# Patient Record
Sex: Female | Born: 1935 | Race: White | Hispanic: No | Smoking: Never smoker
Health system: Southern US, Community
[De-identification: ages and names within clinical notes are randomized; demographics above are authoritative.]

## PROBLEM LIST (undated history)

## (undated) DIAGNOSIS — M1990 Unspecified osteoarthritis, unspecified site: Secondary | ICD-10-CM

## (undated) DIAGNOSIS — I2510 Atherosclerotic heart disease of native coronary artery without angina pectoris: Secondary | ICD-10-CM

## (undated) DIAGNOSIS — E785 Hyperlipidemia, unspecified: Secondary | ICD-10-CM

## (undated) DIAGNOSIS — I10 Essential (primary) hypertension: Secondary | ICD-10-CM

## (undated) DIAGNOSIS — N390 Urinary tract infection, site not specified: Secondary | ICD-10-CM

## (undated) DIAGNOSIS — B029 Zoster without complications: Secondary | ICD-10-CM

## (undated) DIAGNOSIS — M4800 Spinal stenosis, site unspecified: Secondary | ICD-10-CM

## (undated) DIAGNOSIS — I350 Nonrheumatic aortic (valve) stenosis: Secondary | ICD-10-CM

## (undated) DIAGNOSIS — Z87312 Personal history of (healed) stress fracture: Secondary | ICD-10-CM

## (undated) HISTORY — DX: Spinal stenosis, site unspecified: M48.00

## (undated) HISTORY — DX: Nonrheumatic aortic (valve) stenosis: I35.0

## (undated) HISTORY — DX: Essential (primary) hypertension: I10

## (undated) HISTORY — DX: Hyperlipidemia, unspecified: E78.5

## (undated) HISTORY — PX: ABDOMINAL SURGERY: SHX537

## (undated) HISTORY — DX: Atherosclerotic heart disease of native coronary artery without angina pectoris: I25.10

## (undated) HISTORY — PX: EYE SURGERY: SHX253

---

## 1998-01-23 ENCOUNTER — Other Ambulatory Visit: Admission: RE | Admit: 1998-01-23 | Discharge: 1998-01-23 | Payer: Self-pay | Admitting: Internal Medicine

## 2000-10-08 ENCOUNTER — Encounter: Admission: RE | Admit: 2000-10-08 | Discharge: 2000-10-08 | Payer: Self-pay | Admitting: Geriatric Medicine

## 2000-10-08 ENCOUNTER — Encounter: Payer: Self-pay | Admitting: Geriatric Medicine

## 2001-11-11 ENCOUNTER — Ambulatory Visit (HOSPITAL_BASED_OUTPATIENT_CLINIC_OR_DEPARTMENT_OTHER): Admission: RE | Admit: 2001-11-11 | Discharge: 2001-11-11 | Payer: Self-pay | Admitting: Plastic Surgery

## 2001-12-01 ENCOUNTER — Emergency Department (HOSPITAL_COMMUNITY): Admission: EM | Admit: 2001-12-01 | Discharge: 2001-12-02 | Payer: Self-pay | Admitting: Emergency Medicine

## 2001-12-01 ENCOUNTER — Encounter: Payer: Self-pay | Admitting: Emergency Medicine

## 2001-12-04 ENCOUNTER — Inpatient Hospital Stay (HOSPITAL_COMMUNITY): Admission: EM | Admit: 2001-12-04 | Discharge: 2001-12-10 | Payer: Self-pay | Admitting: *Deleted

## 2001-12-05 ENCOUNTER — Encounter: Payer: Self-pay | Admitting: General Surgery

## 2003-08-23 ENCOUNTER — Encounter: Admission: RE | Admit: 2003-08-23 | Discharge: 2003-08-23 | Payer: Self-pay | Admitting: Geriatric Medicine

## 2004-01-21 ENCOUNTER — Encounter: Admission: RE | Admit: 2004-01-21 | Discharge: 2004-01-21 | Payer: Self-pay | Admitting: Geriatric Medicine

## 2007-05-05 ENCOUNTER — Encounter: Admission: RE | Admit: 2007-05-05 | Discharge: 2007-05-05 | Payer: Self-pay | Admitting: Geriatric Medicine

## 2007-05-11 ENCOUNTER — Encounter: Admission: RE | Admit: 2007-05-11 | Discharge: 2007-05-11 | Payer: Self-pay | Admitting: Geriatric Medicine

## 2007-10-17 ENCOUNTER — Ambulatory Visit: Payer: Self-pay | Admitting: Internal Medicine

## 2007-10-17 ENCOUNTER — Inpatient Hospital Stay (HOSPITAL_COMMUNITY): Admission: EM | Admit: 2007-10-17 | Discharge: 2007-10-18 | Payer: Self-pay | Admitting: Emergency Medicine

## 2007-10-18 HISTORY — PX: CARDIAC CATHETERIZATION: SHX172

## 2009-04-06 ENCOUNTER — Encounter: Admission: RE | Admit: 2009-04-06 | Discharge: 2009-04-06 | Payer: Self-pay | Admitting: Neurological Surgery

## 2009-11-17 ENCOUNTER — Ambulatory Visit: Payer: Self-pay | Admitting: Diagnostic Radiology

## 2009-11-17 ENCOUNTER — Emergency Department (HOSPITAL_BASED_OUTPATIENT_CLINIC_OR_DEPARTMENT_OTHER): Admission: EM | Admit: 2009-11-17 | Discharge: 2009-11-18 | Payer: Self-pay | Admitting: Emergency Medicine

## 2010-08-06 ENCOUNTER — Ambulatory Visit: Payer: Self-pay | Admitting: Cardiovascular Disease

## 2010-09-08 ENCOUNTER — Encounter: Payer: Self-pay | Admitting: Geriatric Medicine

## 2010-09-09 ENCOUNTER — Encounter: Payer: Self-pay | Admitting: Geriatric Medicine

## 2010-11-06 LAB — URINALYSIS, ROUTINE W REFLEX MICROSCOPIC
Glucose, UA: NEGATIVE mg/dL
Hgb urine dipstick: NEGATIVE
Ketones, ur: NEGATIVE mg/dL
pH: 5.5 (ref 5.0–8.0)

## 2010-11-06 LAB — DIFFERENTIAL
Basophils Relative: 0 % (ref 0–1)
Eosinophils Absolute: 0.1 10*3/uL (ref 0.0–0.7)
Monocytes Absolute: 0.7 10*3/uL (ref 0.1–1.0)
Monocytes Relative: 7 % (ref 3–12)
Neutro Abs: 8.1 10*3/uL — ABNORMAL HIGH (ref 1.7–7.7)

## 2010-11-06 LAB — URINE MICROSCOPIC-ADD ON

## 2010-11-06 LAB — CBC
Platelets: 288 10*3/uL (ref 150–400)
RDW: 12.5 % (ref 11.5–15.5)

## 2010-11-06 LAB — COMPREHENSIVE METABOLIC PANEL
ALT: 12 U/L (ref 0–35)
AST: 24 U/L (ref 0–37)
Albumin: 4.1 g/dL (ref 3.5–5.2)
Alkaline Phosphatase: 56 U/L (ref 39–117)
Potassium: 3.6 mEq/L (ref 3.5–5.1)
Sodium: 145 mEq/L (ref 135–145)
Total Protein: 7.9 g/dL (ref 6.0–8.3)

## 2010-11-06 LAB — POCT CARDIAC MARKERS: Myoglobin, poc: 61.4 ng/mL (ref 12–200)

## 2010-12-31 NOTE — Cardiovascular Report (Signed)
NAMESHERMAN, LIPUMA NO.:  [id number]   MEDICAL RECORD NO.:  [id number]          PATIENT TYPE:  INP   LOCATION:  3713                         FACILITY:  [facility]   PHYSICIAN:  Vesta Mixer, M.D. DATE OF BIRTH:  [birth year]   DATE OF PROCEDURE:  10/18/2007  DATE OF DISCHARGE:  10/18/2007                            CARDIAC CATHETERIZATION   [name] Fields is an elderly female who was admitted for cardiac  catheterization after having episodes of chest pain.   PROCEDURE:  Left heart catheterization with coronary angiography.  The  right femoral artery was easily cannulated using modified Seldinger  technique.   HEMODYNAMICS:  LV pressure is 114/0 with an aortic pressure of 103/53.   ANGIOGRAPHY:  Left Main.  The left main has minor luminal  irregularities, but it is basically unremarkable.   There is a room of calcium in the proximal aspect of the left main.   Left anterior descending artery is moderate-sized branch.  There are  mild irregularities.  There is a 10-20% stenosis proximally followed by  a 30% stenosis in the mid segment.  The distal LAD is fairly tortuous,  but is otherwise unremarkable.   There is a relatively small first diagonal branch.  There is a  moderately tight stenosis in the first diagonal branch between 70-80%.  This stenosis arises just after the takeoff.  The diameter of this  branch is approximately 1.5 mm in diameter, and is not a good candidate  for angioplasty.   The second diagonal branch is basically unremarkable.   The left circumflex artery is a moderate-sized branch and is fairly  tortuous.  It gives off the first obtuse marginal artery which has minor  luminal irregularities.  The terminal circumflex artery is unremarkable.   The right coronary artery is moderate size and is dominant.  There is  mild-to-moderate irregularities.  There is mid 30-40% stenosis.  The  distal RCA is unremarkable.  The right coronary  artery gives off a  moderate-sized posterior descending artery, which is fairly normal.  The  posterolateral branch is quite small.   The left ventriculogram was performed in a 30-RAO position.  It reveals  hyperdynamic left ventricular systolic function.  The left ventricular  ejection fraction is around 70%.  There are no segmental wall motion  abnormalities.  There is no significant mitral regurgitation.   COMPLICATIONS:  None.   CONCLUSIONS:  1. Moderate coronary artery disease.  There is a tight stenosis in a      very small first diagonal artery.  This vessel is proximally 1.5 mm      in size and really is not a good candidate for angioplasty.  If she      continues to have angina, we could consider a balloon angioplasty      or perhaps cutting balloon procedure.  I do not think that it is      big      enough to warrant stenting, although we could consider using a      MiniVision stent if needed.  We will continue with aggressive  medical therapy.  2. She has an aortic valve gradient of approximately 10 mmHg.  We will      continue to follow this.           ______________________________  Vesta Mixer, M.D.     PJN/MEDQ  D:  12/09/2007  T:  12/09/2007  Job:  [id number]

## 2010-12-31 NOTE — H&P (Signed)
[name], [name] Fields NO.:  [id number]   MEDICAL RECORD NO.:  [id number]          PATIENT TYPE:  EMS   LOCATION:  MAJO                         FACILITY:  [facility]   PHYSICIAN:  [name] Furlong, MD      DATE OF BIRTH:  1925/01/06   DATE OF ADMISSION:  10/17/2007  DATE OF DISCHARGE:                              HISTORY & PHYSICAL   PRIMARY CARE PHYSICIAN:  Hal T. Stoneking, M.D.   CHIEF COMPLAINT:  Chest pain.   HISTORY OF PRESENT ILLNESS:  [name] is an 77 year old female.  She  started having intermittent sharp chest pain in the center of chest  associated with radiation to both sides of jaws and mid thoracic back.  She felt like she is having acid reflux and indigestion.  She tried Gas-  X that did not help.  She took two aspirins that did not help either.  She did have some shortness of breath with chest pain.  Chest pain  lasted less than one minute very time it came on.  The patient did not  have a history of gallstones, gastroesophageal reflux disease, pneumonia  or heart disease.  She had history of stress test 10 years ago, but she  does not have any history of cardiac catheterization.  The patient  denied any alcohol or tobacco use.   PAST MEDICAL HISTORY:  1. Hypertension.  2. Osteoporosis.  3. Spinal stenosis.   ALLERGIES:  No known drug allergies.   MEDICATIONS:  Hydrochlorothiazide.   FAMILY HISTORY:  Noncontributory.  Family history of diabetes mellitus  in the father.   SOCIAL HISTORY:  The patient denied any alcohol, drug or tobacco use.   REVIEW OF SYSTEMS:  Positive as per HPI, otherwise, negative.  Review of  systems done on 14 systems.   PHYSICAL EXAMINATION:  VITAL SIGNS:  Blood pressure 159/60, heart rate  81, temperature 97.3, respirations 18, oxygen saturation 99% on 1  liters.  GENERAL:  Alert and oriented x3.  No acute distress.  CARDIOVASCULAR:  S1, S2 regular.  No murmurs, rubs or gallops.  LUNG:  Clear to auscultation  bilaterally.  No wheezes, rales or rhonchi.  ABDOMEN:  Nontender, nondistended, bowel sounds present.  No  organomegaly.  EXTREMITIES:  No cyanosis, clubbing or edema.  Pulses palpable in all  four extremities.  HEENT:  Normocephalic, atraumatic.  Eyes:  Pupils equal, round and  reactive to light and accommodation.  Extraocular movements intact.  Oral cavity:  Oral mucosa moist.  No thrush noted.  NECK:  No thyromegaly or JVD.  SKIN:  No rash or bruits.  NEUROLOGICAL:  Intact cranial nerves, muscular strength, sensation and  reflexes.   LABORATORY DATA:  WBC 8.9, hemoglobin 13.6, platelets 273.  Cardiac  enzymes normal.  Creatinine 1.1.  Potassium 3.2, sodium 140, glucose 96,  BUN 22. Chest x-ray unremarkable.  EKG does not show any acute ST-T wave  changes.   ASSESSMENT/PLAN:  1. Atypical chest pain, rule out MI.  The patient may have underlying      gastroesophageal reflux disease.  2. Hypertension.  3. History of osteoporosis.  4. History of spinal stenosis.   PLAN:  1. Will admit the patient to telemetry bed. We will rule out MI with      cardiac enzymes with troponin x3.  First set is negative in the ER.      We will get exercise stress test with nuclear imaging in the      morning.  Will get [facility] consult in the morning for      stress test and further workup.  2. Will start patient on Lovenox full dose to rule out MI.  3. Will start the patient on aspirin, beta blocker, lisinopril,      Simvastatin and NitroPaste according to MI protocol.  We will      monitor the patient's blood pressure and vitals every four hours.      Will check CBC, BMP, magnesium phosphate, EKG in the morning.  4. Further plan and workup according to the labs pending in the      morning.      [name] Furlong, MD  Electronically Signed     TVP/MEDQ  D:  10/17/2007  T:  10/17/2007  Job:  [id number]   cc:   Michiel Cowboy, MD  Hal T. Stoneking, M.D.

## 2010-12-31 NOTE — Consult Note (Signed)
[name], [name] NO.:  [id number]   MEDICAL RECORD NO.:  [id number]          PATIENT TYPE:  INP   LOCATION:  1830                         FACILITY:  [facility]   PHYSICIAN:  Vesta Mixer, M.D. DATE OF BIRTH:  01/14/1925   DATE OF CONSULTATION:  10/17/2007  DATE OF DISCHARGE:                                 CONSULTATION   HISTORY OF PRESENT ILLNESS:  [name] Fields is an 77 year old female  with a history of hypertension.  She is admitted to the hospital with  episodes of chest pain.  We are asked to see her for further evaluation.   The patient has a long history of hypertension.  She has been relatively  stable.  Yesterday, she started having intermittent episodes of jaw pain  which distended down into her chest.  She had multiple episodes.  These  episodes were described as a chest pressure with radiation up into her  jaw.  She also had some very sharp pains that lasted only a few seconds.  These episodes occurred every 2-3 hours and would resolve spontaneously.  There was some shortness of breath.  There was no diaphoresis, nausea or  vomiting.  The patient presents to the emergency room where she  continued to have these episodes until she received Lovenox.  The  patient is currently pain free.   CURRENT MEDICATIONS:  1. HCTZ 25 mg a day.  2. Fosamax once a week.   PAST MEDICAL HISTORY:  Hypertension.   SOCIAL HISTORY:  The patient is a nonsmoker and nondrinker.   REVIEW OF SYSTEMS:  Negative.   PHYSICAL EXAMINATION:  GENERAL:  The patient is an elderly female in no  acute distress.  She is currently pain free.  Her heart rate 78, blood  pressure 125/70.  HEENT:  Exam reveals 2+ carotids.  No bruits, no JVD, no thyromegaly.  LUNGS:  Clear to auscultation.  HEART:  Regular rate, S1-S2.  ABDOMEN:  Reveals good bowel sounds and is nontender.  EXTREMITIES:  There is no clubbing, cyanosis or edema.  NEUROLOGICAL:  Exam is nonfocal.   STUDIES:   Her EKG reveals normal sinus rhythm.  She has no ST or T-wave  changes.   Cardiac enzymes are negative.  Her chemistry and CBC are within normal  limits.   ASSESSMENT:  [name] presents with some episodes of chest pain that  sounds consistent with unstable angina.  I have elected to cancel stress  test and proceed with heart catheterization tomorrow.  I am worried that  she may have a tight lesion and an exercise test would potentially be  dangerous.  We have discussed the risks, benefits and options of heart  catheterization.  She understands and agrees to proceed.           ______________________________  Vesta Mixer, M.D.     PJN/MEDQ  D:  10/17/2007  T:  10/18/2007  Job:  [id number]   cc:   Hal T. Stoneking, M.D.

## 2011-01-03 NOTE — Discharge Summary (Signed)
[place]  Patient:    [name] Fields, [name] Fields Visit Number: [id number] MRN: [id number]          Service Type: SUR Location: 5700 [id number] Attending Physician:  Cherylynn Ridges Dictated by:   Jimmye Norman, M.D. Admit Date:  12/03/2001 Discharge Date: 12/10/2001   CC:         Hal T. Stoneking, M.D.   Discharge Summary  DISCHARGE DIAGNOSIS: Mechanical small bowel obstruction.  PROCEDURE: Exploratory laparotomy with enterolysis.  SURGEON: Dr. Lindie Spruce.  DISCHARGE MEDICATIONS: 1. Darvocet N-100 to take as needed. 2. Reglan to take as needed.  ACTIVITY: No driving for one week postop.  WOUND CARE: Shower and pat wound dry.  FOLLOW-UP: To see me on the December 14, 2001.  HOSPITAL COURSE: The patient was admitted on December 04, 2001, early morning or late evening of December 03, 2001 with a bowel obstruction. We watched her with an NG tube in place which had drained out over three liters of feculent fluid without any drastic improvement. She was taken to surgery on December 05, 2001 at which time she had a mechanical small bowel obstruction with adhesions in the right lower quadrant and the pelvis. She underwent an enterolysis without a bowel resection and postoperatively did well. She was opened up and was able to tolerated a diet well with bowel movements by hospital day six to seven.  DISPOSITION: The patient was discharged to home in good condition. Her staples were intact and they were to be removed once she came into the clinic. Dictated by:   Jimmye Norman, M.D. Attending Physician:  Cherylynn Ridges DD:  12/22/01 TD:  12/25/01 Job: [id number]

## 2011-01-03 NOTE — Op Note (Signed)
. [facility]  Patient:    [name] Fields, [name] Fields Visit Number: [id number] MRN: [id number]          Service Type: SUR Location: 5700 [id number] [id number] Attending Physician:  Cherylynn Ridges Dictated by:   Jimmye Norman, M.D. Proc. Date: 12/05/01 Admit Date:  12/03/2001                             Operative Report  PREOPERATIVE DIAGNOSIS:  Small bowel obstruction.  POSTOPERATIVE DIAGNOSIS:  Small bowel obstruction, from intra-abdominal adhesions.  OPERATION PERFORMED:  Exploratory laparotomy.  SURGEON:  Jimmye Norman, M.D.  ASSISTANT:  None.  ANESTHESIA:  General endotracheal.  ESTIMATED BLOOD LOSS:  Less than 50 cc.  COMPLICATIONS:  None.  CONDITION:  Stable.  INDICATIONS FOR PROCEDURE:  The patient is a 77 year old female with feculent vomiting for several days.  She came to the emergency department.  Nasogastric tube was placed.  She has had over 3L of fluid taken out and now after nonresolution on abdominal films of her bowel obstruction, she goes to the operating room.  OPERATIVE FINDINGS:  The patient had adhesions of her distal small bowel the right lower quadrant from previous area of probably hysterectomy.  There was a loop of bowel which was actually kinked underneath adhesions and through a hole where there was acute obstruction.  There was no infarction or compromised bowel.  No other pathology was noted.  There was a right ovary which was left in place.  It had some nodularity to it but no evidence of malignancy.  DESCRIPTION OF PROCEDURE:  The patient was taken to the operating room and placed on the table in a supine position.  After an adequate endotracheal anesthetic was administered, she was prepped and draped in the usual sterile manner exposing the midline of the abdomen.  The lower midline incision was made using a #10 blade down to and through the old scar.  We entered the abdomen in the upper portion above the umbilicus where  there had been no previous surgery.  We were able to open it up through the midline fascia inferiorly.  We took down several adhesions of the omentum to the anterior abdominal wall but there was no abdominal contents attached to that omentum per se.  We palpated the liver, the gallbladder, the stomach, the spleen, found there to be no evidence of tumor.  In the pelvic area there was a loop of small bowel which was stuck down in the right hemipelvis which popped up and we could see the hole where it had gone through and been obstructed.  We took down the adhesions that were caught in that area which had tethered down the previously obstructed small bowel loop.  This was taken down with electrocautery and also Metzenbaum scissors.  We were able to completely bring up this loop of small bowel without injuring it.  Once this was done, we irrigated with saline and then we closed the abdomen.  Again, further exploration did not demonstrate any evidence of other pathology.   The ovary on the right side, however, was hard and prominent but had no evidence of tumor.  We reapproximated the fascia using a running #1 PDS suture and then the skin with stainless steel staples.  Sponge, needle and instrument counts were correct. Dictated by:   Jimmye Norman, M.D. Attending Physician:  Cherylynn Ridges DD:  12/06/01 TD:  12/06/01 Job: [id number]

## 2011-01-03 NOTE — H&P (Signed)
[place]  Patient:    [name] Fields, [name] Fields Visit Number: [id number] MRN: [id number]          Service Type: EMS Location: MINO Attending Physician:  Corlis Leak. Dictated by:   Jimmye Norman, M.D. Admit Date:  12/03/2001   CC:         Hal T. Stoneking, M.D.   History and Physical  IDENTIFICATION AND CHIEF COMPLAINT:  The patient is a 77 year old woman with several days of abdominal discomfort and nausea/vomiting who has a partial small bowel obstruction.  HISTORY OF PRESENT ILLNESS:  The patient has been having symptoms for the last two or three days of decreased appetite and increasing nausea and vomiting. She also passed out secondary to her vomiting one time but came in here because of continued abdominal discomfort, nausea, and vomiting.  X-rays done in the emergency department to rule out obstruction showed what was thought to be free air and a surgical consultation was obtained.  I came in to evaluate the patient, saw her initially and found that she was sitting up in bed, talking freely, without diffuse peritonitis.  She was afebrile and cognizant of everything that was going on.  Did not appear to be someone with diffuse peritonitis.  On reviewing her x-rays it looked as though she just had a small air level in the stomach under the left hemidiaphragm but no free air, certainly none on the right side.  Her x-ray was typical for a bowel obstruction.  The patient reports to me having had a feculent effluent coming from her mouth with vomiting over the last several days.  This constellation of findings makes me believe she has acute small bowel obstruction and she is being admitted for IV hydration and NG tube decompression.  PAST MEDICAL HISTORY:  Is very unremarkable.  She has had no cardiac disease, renal disease, pulmonary or liver disease.  PAST SURGICAL HISTORY:  She has had two C sections in the past and a hysterectomy.   All of these were done over 50 years ago, she said.  MEDICATIONS:  ______ is her only medication.  ALLERGIES:  No known drug allergies.  REVIEW OF SYSTEMS:  The patient has not had a bowel movement since Wednesday. No flatus in the last 24-48 hours.  PHYSICAL EXAMINATION:  GENERAL:  She is a well-nourished pleasant woman in mild to moderate acute distress.  VITAL SIGNS:  Temperature 98.8, pulse of between 101 and 126, and a blood pressure of 115/45.  HEENT:  She is normocephalic and atraumatic and anicteric.  She does have some mild conjunctival injection of her left eye.  NECK:  Supple, no bruits.  No palpable masses.  CHEST:  Clear to auscultation.  CARDIAC:  Regular rhythm.  She is mildly tachycardic.  ABDOMEN:  Distended without diffuse peritonitis or tenderness, tinkling and rushes of bowel sounds.  Old midline scar in the lower infraumbilical area.  RECTAL:  Deferred but by report is normal.  I placed an NG tube in the patient and got out over 1400 cc of feculent effluent.  LABORATORY STUDIES:  The white count is normal.  The hemoglobin is about 13 which probably is hemoconcentrated.  IMPRESSION:  Partial, possibly complete small bowel obstruction based on clinical examination and the x-ray findings.  The patient requires NG tube decompression and IV hydration.  PLAN:  The plan is to admit the patient for NG tube decompression and IV hydration along with repeating x-rays and laboratory studies.  If she should show signs of improvement in the next 24-48 hours then laparotomy will not be necessary.  However, if she shows no sign of clearing, then she will go for a laparotomy. Dictated by:   Jimmye Norman, M.D. Attending Physician:  Corlis Leak DD:  12/04/01 TD:  12/04/01 Job: [id number]

## 2011-03-24 ENCOUNTER — Other Ambulatory Visit: Payer: Self-pay | Admitting: *Deleted

## 2011-03-24 MED ORDER — METOPROLOL TARTRATE 50 MG PO TABS: ORAL_TABLET | ORAL | Status: DC

## 2011-03-24 NOTE — Telephone Encounter (Signed)
Fax received from pharmacy. Refill completed. Jodette Laquonda Welby RN  

## 2011-04-07 ENCOUNTER — Telehealth: Payer: Self-pay | Admitting: Cardiovascular Disease

## 2011-04-07 MED ORDER — METOPROLOL TARTRATE 50 MG PO TABS: ORAL_TABLET | ORAL | Status: DC

## 2011-04-07 NOTE — Telephone Encounter (Signed)
Out of her Metoprolol and wants it called/escribed to [facility] [address]

## 2011-05-12 LAB — TROPONIN I: Troponin I: <0.01

## 2011-05-12 LAB — CBC
MCHC: 33.2
MCV: 86.8
MCV: 87.7
Platelets: 267
Platelets: 270
RBC: 4.64
RDW: 14.2
RDW: 14.6
WBC: 8.9
WBC: 9.3

## 2011-05-12 LAB — CARDIAC PANEL(CRET KIN+CKTOT+MB+TROPI)
CK, MB: 1.9
Relative Index: INVALID
Total CK: 65
Troponin I: 0.01
Troponin I: 0.01

## 2011-05-12 LAB — I-STAT 8, (EC8 V) (CONVERTED LAB)
Chloride: 110
HCT: 42
Hemoglobin: 14.3
Operator id: 222501
Potassium: 3.2 — ABNORMAL LOW
Sodium: 140
pH, Ven: 7.456 — ABNORMAL HIGH

## 2011-05-12 LAB — COMPREHENSIVE METABOLIC PANEL
AST: 20
Albumin: 3.5
BUN: 16
CO2: 24
Calcium: 9
Creatinine, Ser: 0.67
GFR calc Af Amer: >60
GFR calc non Af Amer: >60

## 2011-05-12 LAB — MAGNESIUM: Magnesium: 2

## 2011-05-12 LAB — CK TOTAL AND CKMB (NOT AT ARMC)
CK, MB: 2
Relative Index: INVALID
Relative Index: INVALID
Total CK: 61

## 2011-05-12 LAB — DIFFERENTIAL
Basophils Absolute: 0
Eosinophils Absolute: 0.1
Eosinophils Relative: 1
Lymphocytes Relative: 30
Lymphs Abs: 2.8
Lymphs Abs: 2.8
Monocytes Relative: 9
Neutro Abs: 5.5
Neutrophils Relative %: 57

## 2011-05-12 LAB — POCT CARDIAC MARKERS
CKMB, poc: 1.1
Myoglobin, poc: 69.2
Myoglobin, poc: 69.4
Operator id: 222501
Operator id: 222501
Troponin i, poc: <0.05

## 2011-05-12 LAB — HEPARIN LEVEL (UNFRACTIONATED): Heparin Unfractionated: 1.16 — ABNORMAL HIGH

## 2011-05-12 LAB — POCT I-STAT CREATININE: Creatinine, Ser: 1.1

## 2011-08-06 ENCOUNTER — Ambulatory Visit: Payer: Self-pay | Admitting: Cardiovascular Disease

## 2011-08-06 ENCOUNTER — Encounter: Payer: Self-pay | Admitting: *Deleted

## 2011-08-06 DIAGNOSIS — I350 Nonrheumatic aortic (valve) stenosis: Secondary | ICD-10-CM | POA: Insufficient documentation

## 2011-08-06 DIAGNOSIS — M4800 Spinal stenosis, site unspecified: Secondary | ICD-10-CM | POA: Insufficient documentation

## 2011-08-06 DIAGNOSIS — M810 Age-related osteoporosis without current pathological fracture: Secondary | ICD-10-CM | POA: Insufficient documentation

## 2011-08-06 DIAGNOSIS — R079 Chest pain, unspecified: Secondary | ICD-10-CM | POA: Insufficient documentation

## 2011-09-22 ENCOUNTER — Other Ambulatory Visit: Payer: Self-pay | Admitting: *Deleted

## 2011-09-22 MED ORDER — METOPROLOL SUCCINATE ER 50 MG PO TB24: 50.0000 mg | ORAL_TABLET | Freq: Every day | ORAL | Status: DC

## 2011-09-22 NOTE — Telephone Encounter (Signed)
Pt needs appointment then refill can be made Fax Received. Refill Completed. [name] (R.M.A)   

## 2011-09-25 ENCOUNTER — Other Ambulatory Visit: Payer: Self-pay | Admitting: *Deleted

## 2011-09-29 ENCOUNTER — Other Ambulatory Visit: Payer: Self-pay | Admitting: *Deleted

## 2011-09-30 ENCOUNTER — Other Ambulatory Visit: Payer: Self-pay | Admitting: *Deleted

## 2011-10-14 ENCOUNTER — Ambulatory Visit (INDEPENDENT_AMBULATORY_CARE_PROVIDER_SITE_OTHER): Payer: Medicare Other | Admitting: Cardiovascular Disease

## 2011-10-14 ENCOUNTER — Encounter: Payer: Self-pay | Admitting: Cardiovascular Disease

## 2011-10-14 DIAGNOSIS — I359 Nonrheumatic aortic valve disorder, unspecified: Secondary | ICD-10-CM

## 2011-10-14 DIAGNOSIS — I10 Essential (primary) hypertension: Secondary | ICD-10-CM

## 2011-10-14 DIAGNOSIS — R079 Chest pain, unspecified: Secondary | ICD-10-CM

## 2011-10-14 DIAGNOSIS — I350 Nonrheumatic aortic (valve) stenosis: Secondary | ICD-10-CM

## 2011-10-14 NOTE — Assessment & Plan Note (Signed)
For Eric stenosis is mild. She's not having significant problems. We'll continue to follow her.

## 2011-10-14 NOTE — Progress Notes (Signed)
    [name] Fields Date of Birth  [birth year] [facility]     [place] Office  [address]    [address]   [address] [place], [place]  [address]    [place], [place]  [address] [phone]  Fax  [phone]  [phone]  Fax [phone]  Problems: 1. CAD -  2.  Hypertension 3. Hyperlipidemia  History of Present Illness:  [name] Fields is doing well.  She is still working as a Theatre stage manager at [employer].  She is not having any problems.  Current Outpatient Prescriptions on File Prior to Visit  Medication Sig Dispense Refill  . aspirin 81 MG tablet Take 81 mg by mouth daily.        . hydrochlorothiazide (MICROZIDE) 12.5 MG capsule Take 12.5 mg by mouth daily.        . metoprolol succinate (TOPROL-XL) 50 MG 24 hr tablet Take 1 tablet (50 mg total) by mouth daily.  30 tablet  0  . nitroGLYCERIN (NITROSTAT) 0.4 MG SL tablet Place 0.4 mg under the tongue every 5 (five) minutes as needed.          No Known Allergies  Past Medical History  Diagnosis Date  . Coronary artery disease     1st diag. stenosis of 70%  . Hyperlipidemia   . Hypertension   . Aortic stenosis     mild  . Chest pain   . Osteoporosis   . Spinal stenosis     Past Surgical History  Procedure Date  . Cardiac catheterization 10/18/2007    mild to moderate CAD,aggressive medical therapy    History  Smoking status  . Never Smoker   Smokeless tobacco  . Not on file    History  Alcohol Use     No family history on file.  Reviw of Systems:  Reviewed in the HPI.  All other systems are negative.  Physical Exam: Blood pressure 149/78, pulse 59, height 5' (1.524 m), weight 121 lb 1.9 oz (54.94 kg). General: Well developed, well nourished, in no acute distress.  Head: Normocephalic, atraumatic, sclera non-icteric, mucus membranes are moist,   Neck: Supple. Carotids are 2 + without bruits. No JVD  Lungs: Clear bilaterally to auscultation.  Heart: regular rate  With normal  S1 S2. No  murmurs, gallops or rubs.  Abdomen: Soft, non-tender, non-distended with normal bowel sounds. No hepatomegaly. No rebound/guarding. No masses.  Msk:  Strength and tone are normal  Extremities: No clubbing or cyanosis. No edema.  Distal pedal pulses are 2+ and equal bilaterally.  Neuro: Alert and oriented X 3. Moves all extremities spontaneously.  Psych:  Responds to questions appropriately with a normal affect.  ECG: Sinus bradycardia.  Poor R wave progression - possible anterior MI.  Assessment / Plan:

## 2011-10-14 NOTE — Patient Instructions (Signed)
Your physician wants you to follow-up in: 1 year  You will receive a reminder letter in the mail two months in advance. If you don't receive a letter, please call our office to schedule the follow-up appointment.   Your physician recommends that you return for a FASTING lipid profile: 1 year   

## 2011-10-14 NOTE — Assessment & Plan Note (Signed)
She's not had any further episodes of chest pain.  I'll see her again in one year.

## 2011-10-15 ENCOUNTER — Other Ambulatory Visit: Payer: Self-pay | Admitting: *Deleted

## 2011-10-15 MED ORDER — METOPROLOL SUCCINATE ER 50 MG PO TB24: 50.0000 mg | ORAL_TABLET | Freq: Every day | ORAL | Status: DC

## 2011-10-17 ENCOUNTER — Other Ambulatory Visit: Payer: Self-pay | Admitting: *Deleted

## 2011-10-17 NOTE — Telephone Encounter (Signed)
Fax Received. Refill Completed. Sharnetta Gielow Chowoe (R.M.A)   

## 2011-10-21 ENCOUNTER — Telehealth: Payer: Self-pay | Admitting: Cardiovascular Disease

## 2011-10-21 MED ORDER — METOPROLOL TARTRATE 50 MG PO TABS: 25.0000 mg | ORAL_TABLET | Freq: Two times a day (BID) | ORAL | Status: DC

## 2011-10-21 NOTE — Telephone Encounter (Signed)
Pt was on metoprolol succinate 50 1/2 tab twice a day, med was changed to metoprolol tartrate after last visit and it is 4 times more expensive , was doing fine on succinate and would like to go back on that if ok's by dr Elease Hashimoto,  uses [facility] [phone], pls call pt [phone]

## 2011-10-21 NOTE — Telephone Encounter (Signed)
PT WAS ON TARTRATE NOT SUCCINATE, ORDER COMPLETED.

## 2012-09-27 ENCOUNTER — Other Ambulatory Visit: Payer: Self-pay | Admitting: *Deleted

## 2012-09-27 NOTE — Telephone Encounter (Signed)
Spoke to patient to make appointment with dr.nahser due to rx refill request from pharmacy. Patient states she has enough pills to last her for a while. Patient also states she will call office back to make appt due to health problems with now.

## 2012-09-28 ENCOUNTER — Other Ambulatory Visit: Payer: Self-pay | Admitting: *Deleted

## 2012-09-28 NOTE — Telephone Encounter (Signed)
Opened in Error.

## 2012-10-04 ENCOUNTER — Other Ambulatory Visit: Payer: Self-pay | Admitting: *Deleted

## 2012-10-04 MED ORDER — METOPROLOL TARTRATE 50 MG PO TABS: 25.0000 mg | ORAL_TABLET | Freq: Two times a day (BID) | ORAL | Status: DC

## 2012-10-04 NOTE — Telephone Encounter (Signed)
NEED APPOINTMENT

## 2013-01-19 ENCOUNTER — Other Ambulatory Visit: Payer: Self-pay | Admitting: Geriatric Medicine

## 2013-01-19 DIAGNOSIS — R1310 Dysphagia, unspecified: Secondary | ICD-10-CM

## 2013-02-02 ENCOUNTER — Ambulatory Visit
Admission: RE | Admit: 2013-02-02 | Discharge: 2013-02-02 | Disposition: A | Discharge status: Home / Self Care | Payer: Medicare Other | Source: Ambulatory Visit | Attending: Geriatric Medicine | Admitting: Geriatric Medicine

## 2013-02-02 DIAGNOSIS — R1310 Dysphagia, unspecified: Secondary | ICD-10-CM

## 2013-05-23 ENCOUNTER — Telehealth: Payer: Self-pay | Admitting: Cardiovascular Disease

## 2013-05-23 NOTE — Telephone Encounter (Signed)
I have not seen her in 1 1/2 years.  I will need to see her to give cardiac clearance.

## 2013-05-23 NOTE — Telephone Encounter (Signed)
New problem    Fax request sent on  9/19 . Status of cardiac clearance . Procedure on  10/10 .

## 2013-05-23 NOTE — Telephone Encounter (Signed)
Called office/ fax date 05/19/13, pt has not been seen since 09/2011, Dr Elease Hashimoto will address this week when back in office.

## 2013-05-24 NOTE — Telephone Encounter (Signed)
Office closed, unable to leave message, except to talk with answer service.

## 2013-05-24 NOTE — Telephone Encounter (Signed)
Follow up   Need clearance for sx that is sched for 05/27/13 if clearance isn't received sx will have to be canceled.

## 2013-05-25 ENCOUNTER — Telehealth: Payer: Self-pay | Admitting: Cardiovascular Disease

## 2013-05-25 NOTE — Telephone Encounter (Signed)
Pt was called and informed that she will need to be seen if cardiac clearance is needed. Pt upset and does not understand why she needs to be seen. I told her to call [facility] and ask why she needs it, pt states she has never had a heart attack. She asked if Dr Elease Hashimoto could send a msg anyway so she can have eye surgery since she has never had a heart attack. Pt was told no. Pt was told to call [facility] to see if clearance was truely needed. Pt agreed to plan.

## 2013-05-25 NOTE — Telephone Encounter (Signed)
msg left x 2 to call back, nancy is on phone.

## 2013-05-25 NOTE — Telephone Encounter (Signed)
Pt is not able to contact Dr Christain [place] to see why cardiac clearance is needed for cataract surg. The number I have was provided. I informed her that Dr Elease Hashimoto is not in office seeing pts this week.

## 2013-05-25 NOTE — Telephone Encounter (Signed)
New problem   Need to speak to you about pt's cataract sx. She need a clearance can fax it to [phone].

## 2013-05-25 NOTE — Telephone Encounter (Signed)
[name] Fields from Dr Vonna Kotyk office was updated. Pt was made an app tomorrow w/ PA Alben Spittle. [name] Fields will call surg and see if she actually needs clearance. She will have the pt call back and cancel app if not needed.

## 2013-05-25 NOTE — Telephone Encounter (Signed)
See tele note

## 2013-05-26 ENCOUNTER — Ambulatory Visit: Payer: Medicare Other | Admitting: Physician Assistant

## 2013-08-27 ENCOUNTER — Emergency Department (HOSPITAL_BASED_OUTPATIENT_CLINIC_OR_DEPARTMENT_OTHER): Payer: Medicare Other

## 2013-08-27 ENCOUNTER — Emergency Department (HOSPITAL_BASED_OUTPATIENT_CLINIC_OR_DEPARTMENT_OTHER)
Admission: EM | Admit: 2013-08-27 | Discharge: 2013-08-28 | Disposition: A | Discharge status: Home / Self Care | Payer: Medicare Other | Attending: Emergency Medicine | Admitting: Emergency Medicine

## 2013-08-27 ENCOUNTER — Encounter (HOSPITAL_BASED_OUTPATIENT_CLINIC_OR_DEPARTMENT_OTHER): Payer: Self-pay | Admitting: Emergency Medicine

## 2013-08-27 DIAGNOSIS — R51 Headache: Secondary | ICD-10-CM | POA: Insufficient documentation

## 2013-08-27 DIAGNOSIS — I2510 Atherosclerotic heart disease of native coronary artery without angina pectoris: Secondary | ICD-10-CM | POA: Insufficient documentation

## 2013-08-27 DIAGNOSIS — Z7982 Long term (current) use of aspirin: Secondary | ICD-10-CM | POA: Insufficient documentation

## 2013-08-27 DIAGNOSIS — I10 Essential (primary) hypertension: Secondary | ICD-10-CM | POA: Insufficient documentation

## 2013-08-27 DIAGNOSIS — Z8739 Personal history of other diseases of the musculoskeletal system and connective tissue: Secondary | ICD-10-CM | POA: Insufficient documentation

## 2013-08-27 DIAGNOSIS — M542 Cervicalgia: Secondary | ICD-10-CM | POA: Insufficient documentation

## 2013-08-27 DIAGNOSIS — Z79899 Other long term (current) drug therapy: Secondary | ICD-10-CM | POA: Insufficient documentation

## 2013-08-27 DIAGNOSIS — Z95818 Presence of other cardiac implants and grafts: Secondary | ICD-10-CM | POA: Insufficient documentation

## 2013-08-27 DIAGNOSIS — R519 Headache, unspecified: Secondary | ICD-10-CM

## 2013-08-27 DIAGNOSIS — Z8639 Personal history of other endocrine, nutritional and metabolic disease: Secondary | ICD-10-CM | POA: Insufficient documentation

## 2013-08-27 DIAGNOSIS — Z8619 Personal history of other infectious and parasitic diseases: Secondary | ICD-10-CM | POA: Insufficient documentation

## 2013-08-27 DIAGNOSIS — Z862 Personal history of diseases of the blood and blood-forming organs and certain disorders involving the immune mechanism: Secondary | ICD-10-CM | POA: Insufficient documentation

## 2013-08-27 HISTORY — DX: Zoster without complications: B02.9

## 2013-08-27 LAB — CBC WITH DIFFERENTIAL/PLATELET
BASOS PCT: 0 % (ref 0–1)
Basophils Absolute: 0 10*3/uL (ref 0.0–0.1)
EOS ABS: 0 10*3/uL (ref 0.0–0.7)
EOS PCT: 0 % (ref 0–5)
HCT: 37.7 % (ref 36.0–46.0)
Hemoglobin: 12.3 g/dL (ref 12.0–15.0)
LYMPHS ABS: 1.8 10*3/uL (ref 0.7–4.0)
Lymphocytes Relative: 18 % (ref 12–46)
MCH: 28.5 pg (ref 26.0–34.0)
MCHC: 32.6 g/dL (ref 30.0–36.0)
MCV: 87.3 fL (ref 78.0–100.0)
MONOS PCT: 9 % (ref 3–12)
Monocytes Absolute: 0.9 10*3/uL (ref 0.1–1.0)
NEUTROS PCT: 72 % (ref 43–77)
Neutro Abs: 7.2 10*3/uL (ref 1.7–7.7)
PLATELETS: 296 10*3/uL (ref 150–400)
RBC: 4.32 MIL/uL (ref 3.87–5.11)
RDW: 12.2 % (ref 11.5–15.5)
WBC: 9.9 10*3/uL (ref 4.0–10.5)

## 2013-08-27 LAB — GLUCOSE, CSF: Glucose, CSF: 54 mg/dL (ref 43–76)

## 2013-08-27 LAB — BASIC METABOLIC PANEL
BUN: 10 mg/dL (ref 6–23)
CALCIUM: 8.8 mg/dL (ref 8.4–10.5)
CO2: 23 mEq/L (ref 19–32)
Chloride: 103 mEq/L (ref 96–112)
Creatinine, Ser: 0.6 mg/dL (ref 0.50–1.10)
GFR, EST NON AFRICAN AMERICAN: 80 mL/min — AB (ref >90)
GLUCOSE: 102 mg/dL — AB (ref 70–99)
POTASSIUM: 3.4 meq/L — AB (ref 3.7–5.3)
Sodium: 141 mEq/L (ref 137–147)

## 2013-08-27 LAB — PROTEIN, CSF: Total  Protein, CSF: 32 mg/dL (ref 15–45)

## 2013-08-27 MED ORDER — PROMETHAZINE HCL 25 MG/ML IJ SOLN
6.2500 mg | Freq: Once | INTRAMUSCULAR | Status: AC
  Administered 2013-08-27: 6.25 mg via INTRAVENOUS
  Filled 2013-08-27: qty 1

## 2013-08-27 MED ORDER — DIAZEPAM 5 MG PO TABS
5.0000 mg | ORAL_TABLET | Freq: Once | ORAL | Status: AC
  Administered 2013-08-27: 5 mg via ORAL
  Filled 2013-08-27: qty 1

## 2013-08-27 MED ORDER — DIPHENHYDRAMINE HCL 50 MG/ML IJ SOLN
12.5000 mg | Freq: Once | INTRAMUSCULAR | Status: AC
  Administered 2013-08-27: 12.5 mg via INTRAVENOUS
  Filled 2013-08-27: qty 1

## 2013-08-27 MED ORDER — SODIUM CHLORIDE 0.9 % IV BOLUS (SEPSIS)
1000.0000 mL | Freq: Once | INTRAVENOUS | Status: AC
  Administered 2013-08-27: 1000 mL via INTRAVENOUS

## 2013-08-27 MED ORDER — BUPIVACAINE HCL 0.5 % IJ SOLN
50.0000 mL | Freq: Once | INTRAMUSCULAR | Status: AC
Start: 2013-08-27 — End: 2013-08-27
  Administered 2013-08-27: 50 mL
  Filled 2013-08-27: qty 1

## 2013-08-27 MED ORDER — ACETAMINOPHEN 325 MG PO TABS
650.0000 mg | ORAL_TABLET | Freq: Once | ORAL | Status: AC
  Administered 2013-08-27: 650 mg via ORAL
  Filled 2013-08-27: qty 2

## 2013-08-27 MED ORDER — DEXAMETHASONE SODIUM PHOSPHATE 10 MG/ML IJ SOLN
10.0000 mg | Freq: Once | INTRAMUSCULAR | Status: AC
  Administered 2013-08-27: 10 mg via INTRAVENOUS
  Filled 2013-08-27: qty 1

## 2013-08-27 NOTE — ED Provider Notes (Signed)
CSN: [id number]     Arrival date & time 08/27/13  1429 History   First MD Initiated Contact with Patient 08/27/13 1517     Chief Complaint  Patient presents with  . Neck Pain  . Headache   (Consider location/radiation/quality/duration/timing/severity/associated sxs/prior Treatment) HPI  This is an 80 year old female with a history of coronary artery disease, hypertension, hyperlipidemia, and recent history of shingles who presents with headache. Patient reports onset of symptoms this morning at approximately 8:00. She denies worst headache of her life but does state that this headache is different. She states that it started out occipital and has now gone over her whole head. Pain is rated 10 out of 10. She states that there are sharp pains that come and go. She denies any vision changes, weakness, numbness or tingling.  Patient denies any fevers. Patient also endorses neck pain without stiffness.  Past Medical History  Diagnosis Date  . Coronary artery disease     1st diag. stenosis of 70%  . Hyperlipidemia   . Hypertension   . Aortic stenosis     mild  . Chest pain   . Osteoporosis   . Spinal stenosis   . Shingles    Past Surgical History  Procedure Laterality Date  . Cardiac catheterization  10/18/2007    mild to moderate CAD,aggressive medical therapy   No family history on file. History  Substance Use Topics  . Smoking status: Never Smoker   . Smokeless tobacco: Not on file  . Alcohol Use: Not on file   OB History   Grav Para Term Preterm Abortions TAB SAB Ect Mult Living                 Review of Systems  Constitutional: Negative for fever.  Respiratory: Negative for cough, chest tightness and shortness of breath.   Cardiovascular: Negative for chest pain.  Gastrointestinal: Negative for nausea, vomiting and abdominal pain.  Genitourinary: Negative for dysuria.  Musculoskeletal: Positive for neck pain. Negative for back pain.  Skin: Negative for wound.   Neurological: Positive for headaches. Negative for dizziness, speech difficulty and weakness.  Psychiatric/Behavioral: Negative for confusion.  All other systems reviewed and are negative.    Allergies  Review of patient's allergies indicates no known allergies.  Home Medications   Current Outpatient Rx  Name  Route  Sig  Dispense  Refill  . gabapentin (NEURONTIN) 100 MG capsule   Oral   Take 100 mg by mouth at bedtime.         . Lido-Capsaicin-Men-Methyl Sal (MEDI-PATCH-LIDOCAINE EX)   Apply externally   Apply 1 patch topically daily at 12 noon. 12 hours on, 12 hours off         . traMADol (ULTRAM) 50 MG tablet   Oral   Take 50 mg by mouth every 6 (six) hours as needed.         Marland Kitchen aspirin 81 MG tablet   Oral   Take 81 mg by mouth daily.           . diazepam (VALIUM) 2 MG tablet   Oral   Take 1 tablet (2 mg total) by mouth every 8 (eight) hours as needed for muscle spasms.   15 tablet   0   . metoprolol (LOPRESSOR) 50 MG tablet   Oral   Take 0.5 tablets (25 mg total) by mouth 2 (two) times daily.   90 tablet   3    BP 147/87  Pulse 95  Temp(Src) 97.9 F (36.6 C) (Oral)  Resp 21  Ht 5' (1.524 m)  Wt 111 lb (50.349 kg)  BMI 21.68 kg/m2  SpO2 98% Physical Exam  Nursing note and vitals reviewed. Constitutional: She is oriented to person, place, and time. She appears well-developed and well-nourished. No distress.  Elderly  HENT:  Head: Normocephalic and atraumatic.  Mouth/Throat: Oropharynx is clear and moist.  Tenderness palpation over the occiput and the paraspinous muscles of the cervical spine  Eyes: EOM are normal. Pupils are equal, round, and reactive to light.  Neck: Normal range of motion. Neck supple.  No meningismus noted  Cardiovascular: Normal rate, regular rhythm and normal heart sounds.   Pulmonary/Chest: Effort normal and breath sounds normal. No respiratory distress. She has no wheezes.  Abdominal: Soft. Bowel sounds are normal.  There is no tenderness.  Musculoskeletal: She exhibits no edema.  Neurological: She is alert and oriented to person, place, and time. No cranial nerve deficit.  Coordination intact to finger-nose-finger, 5 out of 5 strength in all 4 extremities, fluent speech  Skin: Skin is warm and dry.  Psychiatric: She has a normal mood and affect.    ED Course  LUMBAR PUNCTURE Date/Time: 08/28/2013 2:44 PM Performed by: Ross Marcus, F Authorized by: Ross Marcus, F Consent: Verbal consent obtained. written consent obtained. Risks and benefits: risks, benefits and alternatives were discussed Consent given by: patient Indications: evaluation for subarachnoid hemorrhage Anesthesia: local infiltration Local anesthetic: lidocaine 1% without epinephrine Anesthetic total: 5 ml Patient sedated: no Preparation: Patient was prepped and draped in the usual sterile fashion. Lumbar space: L3-L4 interspace Patient's position: right lateral decubitus Needle gauge: 20 Needle type: spinal needle - Quincke tip Needle length: 3.5 in Number of attempts: 2 Fluid appearance: blood-tinged then clearing Tubes of fluid: 4 Total volume: 5 ml Post-procedure: site cleaned and adhesive bandage applied Patient tolerance: Patient tolerated the procedure well with no immediate complications.   (including critical care time) Labs Review Labs Reviewed  BASIC METABOLIC PANEL - Abnormal; Notable for the following:    Potassium 3.4 (*)    Glucose, Bld 102 (*)    GFR calc non Af Amer 80 (*)    All other components within normal limits  CSF CELL COUNT WITH DIFFERENTIAL - Abnormal; Notable for the following:    Color, CSF PINK (*)    Appearance, CSF CLOUDY (*)    RBC Count, CSF 4200 (*)    All other components within normal limits  CSF CELL COUNT WITH DIFFERENTIAL - Abnormal; Notable for the following:    RBC Count, CSF 205 (*)    All other components within normal limits  CSF CULTURE  CBC WITH DIFFERENTIAL   GLUCOSE, CSF  PROTEIN, CSF   Imaging Review Ct Head Wo Contrast  08/27/2013   CLINICAL DATA:  Headaches, neck pain, recently treated for shingles  EXAM: CT HEAD WITHOUT CONTRAST  TECHNIQUE: Contiguous axial images were obtained from the base of the skull through the vertex without intravenous contrast.  COMPARISON:  None.  FINDINGS: No evidence of parenchymal hemorrhage or extra-axial fluid collection. No mass lesion, mass effect, or midline shift.  No CT evidence of acute infarction.  Subcortical white matter and periventricular small vessel ischemic changes. Intracranial atherosclerosis.  Mild global cortical atrophy, likely age appropriate. No ventriculomegaly.  The visualized paranasal sinuses are essentially clear. The mastoid air cells are unopacified.  No evidence of calvarial fracture.  IMPRESSION: No evidence of acute intracranial abnormality.  Age related atrophy with  small vessel ischemic changes and intracranial atherosclerosis.   Electronically Signed   By: Charline BillsSriyesh  Krishnan M.D.   On: 08/27/2013 16:09    EKG Interpretation   None      Medications  diazepam (VALIUM) tablet 5 mg (5 mg Oral Given 08/27/13 1545)  bupivacaine (MARCAINE) 0.5 % (with pres) injection 50 mL (50 mLs Infiltration Given 08/27/13 1803)  sodium chloride 0.9 % bolus 1,000 mL (0 mLs Intravenous Stopped 08/27/13 2124)  diphenhydrAMINE (BENADRYL) injection 12.5 mg (12.5 mg Intravenous Given 08/27/13 1956)  promethazine (PHENERGAN) injection 6.25 mg (6.25 mg Intravenous Given 08/27/13 1957)  acetaminophen (TYLENOL) tablet 650 mg (650 mg Oral Given 08/27/13 2155)  dexamethasone (DECADRON) injection 10 mg (10 mg Intravenous Given 08/27/13 2158)   .  MDM   1. Headache    Patient presents with headache. She is nontoxic and nonfocal on exam. She does have tenderness to palpation of the occiput. At given acute onset of pain and is being different from prior headaches, CT scan was obtained to evaluate for blood. This is  negative. Patient was given Valium for likely muscle spasm. She continues to endorse pain. CT scan was obtained approximately 8 hours after onset of symptoms. Fully rule out subarachnoid hemorrhage, we'll need to obtain an LP. Patient initially was hesitant to proceed with lumbar puncture. I tried to control her pain with occipital nerve blocks as well as a migraine cocktail. While she appeared more comfortable, she still endorsed continued pain without improvement. Patient also stated that all the medications were making her "drunk." We proceeded with lumbar puncture. It was a traumatic procedure. Results revealed 4200 RBCs in the first tube and 205 RBCs in the 4th tube.  Shared these results with the family. Patient rested comfortably for several hours at this time. I'll send the patient home with a short course of Valium, low dosage. Given the tenderness palpation on exam and occipital nature of headache, I have suspicion for occipital neuralgia; however nerve block did not seem to improve the patient's pain. She has a neurologist who she will see on Monday.  After history, exam, and medical workup I feel the patient has been appropriately medically screened and is safe for discharge home. Pertinent diagnoses were discussed with the patient. Patient was given return precautions.     Shon Batonourtney F Nahomi Hegner, MD 08/28/13 [id number]

## 2013-08-27 NOTE — ED Notes (Signed)
EDP at bedside with ED Tech performing LP. Consent signed, confirmed and placed on chart.

## 2013-08-27 NOTE — ED Notes (Signed)
Patient given hot packs to place behind neck per MD request

## 2013-08-27 NOTE — ED Notes (Signed)
Pt stated she has recently been treated for shingles.  Pt woke up this am with head and neck pain, states her head felt heavy.  No other neuro symptoms.

## 2013-08-28 LAB — CSF CELL COUNT WITH DIFFERENTIAL
RBC Count, CSF: 205 /mm3 — ABNORMAL HIGH
RBC Count, CSF: 4200 /mm3 — ABNORMAL HIGH
TUBE #: 4
Tube #: 1
WBC CSF: 3 /mm3 (ref 0–5)
WBC, CSF: 2 /mm3 (ref 0–5)

## 2013-08-28 MED ORDER — DIAZEPAM 2 MG PO TABS: 2.0000 mg | ORAL_TABLET | Freq: Three times a day (TID) | ORAL | Status: DC | PRN

## 2013-08-28 NOTE — Discharge Instructions (Signed)
Headache  CAUSES  The exact cause of a headache is not always known. However, a migraine may be caused when nerves in the brain become irritated and release chemicals that cause inflammation. This causes pain. Certain things may also trigger migraines, such as:  Alcohol.  Smoking.  Stress.  Menstruation.  Aged cheeses.  Foods or drinks that contain nitrates, glutamate, aspartame, or tyramine.  Lack of sleep.  Chocolate.  Caffeine.  Hunger.  Physical exertion.  Fatigue.  Medicines used to treat chest pain (nitroglycerine), birth control pills, estrogen, and some blood pressure medicines. SIGNS AND SYMPTOMS  Pain on one or both sides of your head.  Pulsating or throbbing pain.  Severe pain that prevents daily activities.  Pain that is aggravated by any physical activity.  Nausea, vomiting, or both.  Dizziness.  Pain with exposure to bright lights, loud noises, or activity.  General sensitivity to bright lights, loud noises, or smells. Before you get a migraine, you may get warning signs that a migraine is coming (aura). An aura may include:  Seeing flashing lights.  Seeing bright spots, halos, or zig-zag lines.  Having tunnel vision or blurred vision.  Having feelings of numbness or tingling.  Having trouble talking.  Having muscle weakness. DIAGNOSIS  A migraine headache is often diagnosed based on:  Symptoms.  Physical exam.  A CT scan or MRI of your head. These imaging tests cannot diagnose migraines, but they can help rule out other causes of headaches. TREATMENT Medicines may be given for pain and nausea. Medicines can also be given to help prevent recurrent migraines.  HOME CARE INSTRUCTIONS  Only take over-the-counter or prescription medicines for pain or discomfort as directed by your health care provider. The use of long-term narcotics is not recommended.  Lie down in a dark, quiet room when you have a migraine.  Keep a journal to  find out what may trigger your migraine headaches. For example, write down:  What you eat and drink.  How much sleep you get.  Any change to your diet or medicines.  Limit alcohol consumption.  Quit smoking if you smoke.  Get 7 9 hours of sleep, or as recommended by your health care provider.  Limit stress.  Keep lights dim if bright lights bother you and make your migraines worse. SEEK IMMEDIATE MEDICAL CARE IF:   Your migraine becomes severe.  You have a fever.  You have a stiff neck.  You have vision loss.  You have muscular weakness or loss of muscle control.  You start losing your balance or have trouble walking.  You feel faint or pass out.  You have severe symptoms that are different from your first symptoms. MAKE SURE YOU:   Understand these instructions.  Will watch your condition.  Will get help right away if you are not doing well or get worse. Document Released: 08/04/2005 Document Revised: 05/25/2013 Document Reviewed: 04/11/2013 [facility] Patient Information 2014 [place], [place].

## 2013-08-31 LAB — CSF CULTURE: CULTURE: NO GROWTH

## 2013-08-31 LAB — CSF CULTURE W GRAM STAIN

## 2013-09-16 ENCOUNTER — Other Ambulatory Visit: Payer: Self-pay | Admitting: Geriatric Medicine

## 2013-09-16 ENCOUNTER — Ambulatory Visit
Admission: RE | Admit: 2013-09-16 | Discharge: 2013-09-16 | Disposition: A | Discharge status: Home / Self Care | Payer: Medicare Other | Source: Ambulatory Visit | Attending: Geriatric Medicine | Admitting: Geriatric Medicine

## 2013-09-16 DIAGNOSIS — M542 Cervicalgia: Secondary | ICD-10-CM

## 2013-09-23 ENCOUNTER — Ambulatory Visit: Payer: Medicare Other | Admitting: Rehabilitative and Restorative Service Providers"

## 2013-10-05 ENCOUNTER — Ambulatory Visit: Payer: Medicare Other | Admitting: Rehabilitative and Restorative Service Providers"

## 2014-01-24 ENCOUNTER — Other Ambulatory Visit: Payer: Self-pay | Admitting: Geriatric Medicine

## 2014-01-24 DIAGNOSIS — R269 Unspecified abnormalities of gait and mobility: Secondary | ICD-10-CM

## 2014-01-24 DIAGNOSIS — G459 Transient cerebral ischemic attack, unspecified: Secondary | ICD-10-CM

## 2014-01-26 ENCOUNTER — Other Ambulatory Visit: Payer: Medicare Other

## 2014-02-01 ENCOUNTER — Other Ambulatory Visit: Payer: Medicare Other

## 2014-02-02 ENCOUNTER — Ambulatory Visit
Admission: RE | Admit: 2014-02-02 | Discharge: 2014-02-02 | Disposition: A | Discharge status: Home / Self Care | Payer: Medicare Other | Source: Ambulatory Visit | Attending: Geriatric Medicine | Admitting: Geriatric Medicine

## 2014-02-02 DIAGNOSIS — R269 Unspecified abnormalities of gait and mobility: Secondary | ICD-10-CM

## 2014-02-02 DIAGNOSIS — G459 Transient cerebral ischemic attack, unspecified: Secondary | ICD-10-CM

## 2014-02-02 MED ORDER — GADOBENATE DIMEGLUMINE 529 MG/ML IV SOLN
10.0000 mL | Freq: Once | INTRAVENOUS | Status: AC | PRN
  Administered 2014-02-02: 10 mL via INTRAVENOUS

## 2014-02-08 ENCOUNTER — Encounter (HOSPITAL_COMMUNITY): Payer: Self-pay | Admitting: Emergency Medicine

## 2014-02-08 ENCOUNTER — Observation Stay (HOSPITAL_COMMUNITY)
Admission: EM | Admit: 2014-02-08 | Discharge: 2014-02-10 | Disposition: A | Discharge status: Skilled Nursing Facility | Payer: Medicare Other | Attending: Internal Medicine | Admitting: Internal Medicine

## 2014-02-08 ENCOUNTER — Emergency Department (HOSPITAL_COMMUNITY): Payer: Medicare Other

## 2014-02-08 DIAGNOSIS — S3210XA Unspecified fracture of sacrum, initial encounter for closed fracture: Secondary | ICD-10-CM

## 2014-02-08 DIAGNOSIS — W19XXXA Unspecified fall, initial encounter: Secondary | ICD-10-CM | POA: Insufficient documentation

## 2014-02-08 DIAGNOSIS — M810 Age-related osteoporosis without current pathological fracture: Secondary | ICD-10-CM | POA: Insufficient documentation

## 2014-02-08 DIAGNOSIS — M47899 Other spondylosis, site unspecified: Secondary | ICD-10-CM

## 2014-02-08 DIAGNOSIS — R262 Difficulty in walking, not elsewhere classified: Secondary | ICD-10-CM | POA: Insufficient documentation

## 2014-02-08 DIAGNOSIS — M8448XA Pathological fracture, other site, initial encounter for fracture: Principal | ICD-10-CM | POA: Insufficient documentation

## 2014-02-08 DIAGNOSIS — S3210XS Unspecified fracture of sacrum, sequela: Secondary | ICD-10-CM

## 2014-02-08 DIAGNOSIS — E785 Hyperlipidemia, unspecified: Secondary | ICD-10-CM | POA: Insufficient documentation

## 2014-02-08 DIAGNOSIS — M4120 Other idiopathic scoliosis, site unspecified: Secondary | ICD-10-CM | POA: Insufficient documentation

## 2014-02-08 DIAGNOSIS — Y92009 Unspecified place in unspecified non-institutional (private) residence as the place of occurrence of the external cause: Secondary | ICD-10-CM | POA: Insufficient documentation

## 2014-02-08 DIAGNOSIS — I359 Nonrheumatic aortic valve disorder, unspecified: Secondary | ICD-10-CM | POA: Insufficient documentation

## 2014-02-08 DIAGNOSIS — M47817 Spondylosis without myelopathy or radiculopathy, lumbosacral region: Secondary | ICD-10-CM | POA: Insufficient documentation

## 2014-02-08 DIAGNOSIS — M1990 Unspecified osteoarthritis, unspecified site: Secondary | ICD-10-CM

## 2014-02-08 DIAGNOSIS — I10 Essential (primary) hypertension: Secondary | ICD-10-CM

## 2014-02-08 DIAGNOSIS — M4800 Spinal stenosis, site unspecified: Secondary | ICD-10-CM | POA: Diagnosis present

## 2014-02-08 DIAGNOSIS — R350 Frequency of micturition: Secondary | ICD-10-CM | POA: Insufficient documentation

## 2014-02-08 DIAGNOSIS — M479 Spondylosis, unspecified: Secondary | ICD-10-CM

## 2014-02-08 DIAGNOSIS — Z7982 Long term (current) use of aspirin: Secondary | ICD-10-CM | POA: Insufficient documentation

## 2014-02-08 DIAGNOSIS — M48061 Spinal stenosis, lumbar region without neurogenic claudication: Secondary | ICD-10-CM

## 2014-02-08 DIAGNOSIS — I2510 Atherosclerotic heart disease of native coronary artery without angina pectoris: Secondary | ICD-10-CM | POA: Insufficient documentation

## 2014-02-08 LAB — BASIC METABOLIC PANEL
BUN: 21 mg/dL (ref 6–23)
CO2: 24 mEq/L (ref 19–32)
Calcium: 8.8 mg/dL (ref 8.4–10.5)
Chloride: 102 mEq/L (ref 96–112)
Creatinine, Ser: 0.56 mg/dL (ref 0.50–1.10)
GFR calc Af Amer: >90 mL/min (ref >90)
GFR calc non Af Amer: 82 mL/min — ABNORMAL LOW (ref >90)
Glucose, Bld: 96 mg/dL (ref 70–99)
Potassium: 3.8 mEq/L (ref 3.7–5.3)
Sodium: 139 mEq/L (ref 137–147)

## 2014-02-08 LAB — CBC WITH DIFFERENTIAL/PLATELET
Basophils Absolute: 0 10*3/uL (ref 0.0–0.1)
Basophils Relative: 0 % (ref 0–1)
Eosinophils Absolute: 0 10*3/uL (ref 0.0–0.7)
Eosinophils Relative: 0 % (ref 0–5)
HCT: 38.7 % (ref 36.0–46.0)
Hemoglobin: 13 g/dL (ref 12.0–15.0)
Lymphocytes Relative: 21 % (ref 12–46)
Lymphs Abs: 2.1 10*3/uL (ref 0.7–4.0)
MCH: 28.4 pg (ref 26.0–34.0)
MCHC: 33.6 g/dL (ref 30.0–36.0)
MCV: 84.5 fL (ref 78.0–100.0)
Monocytes Absolute: 1.1 10*3/uL — ABNORMAL HIGH (ref 0.1–1.0)
Monocytes Relative: 11 % (ref 3–12)
Neutro Abs: 7 10*3/uL (ref 1.7–7.7)
Neutrophils Relative %: 68 % (ref 43–77)
Platelets: 279 10*3/uL (ref 150–400)
RBC: 4.58 MIL/uL (ref 3.87–5.11)
RDW: 13 % (ref 11.5–15.5)
WBC: 10.2 10*3/uL (ref 4.0–10.5)

## 2014-02-08 LAB — URINALYSIS, ROUTINE W REFLEX MICROSCOPIC
Bilirubin Urine: NEGATIVE
Glucose, UA: NEGATIVE mg/dL
Hgb urine dipstick: NEGATIVE
Ketones, ur: NEGATIVE mg/dL
Leukocytes, UA: NEGATIVE
Nitrite: NEGATIVE
Protein, ur: NEGATIVE mg/dL
Specific Gravity, Urine: 1.01 (ref 1.005–1.030)
Urobilinogen, UA: 0.2 mg/dL (ref 0.0–1.0)
pH: 7.5 (ref 5.0–8.0)

## 2014-02-08 MED ORDER — ONDANSETRON HCL 4 MG/2ML IJ SOLN: 4.0000 mg | INTRAMUSCULAR | Status: DC | PRN

## 2014-02-08 MED ORDER — METOPROLOL TARTRATE 25 MG PO TABS
25.0000 mg | ORAL_TABLET | Freq: Every day | ORAL | Status: DC
  Administered 2014-02-08 – 2014-02-10 (×3): 25 mg via ORAL
  Filled 2014-02-08 (×3): qty 1

## 2014-02-08 MED ORDER — MORPHINE SULFATE 2 MG/ML IJ SOLN: 0.5000 mg | INTRAMUSCULAR | Status: DC | PRN

## 2014-02-08 MED ORDER — NAPROXEN 375 MG PO TABS
375.0000 mg | ORAL_TABLET | Freq: Two times a day (BID) | ORAL | Status: DC
  Administered 2014-02-08 – 2014-02-10 (×4): 375 mg via ORAL
  Filled 2014-02-08 (×9): qty 1

## 2014-02-08 MED ORDER — SENNA 8.6 MG PO TABS
1.0000 | ORAL_TABLET | Freq: Every day | ORAL | Status: DC
  Administered 2014-02-08 – 2014-02-10 (×3): 8.6 mg via ORAL
  Filled 2014-02-08 (×3): qty 1

## 2014-02-08 MED ORDER — ASPIRIN EC 81 MG PO TBEC
81.0000 mg | DELAYED_RELEASE_TABLET | Freq: Every day | ORAL | Status: DC
  Administered 2014-02-08 – 2014-02-10 (×3): 81 mg via ORAL
  Filled 2014-02-08 (×3): qty 1

## 2014-02-08 MED ORDER — METHOCARBAMOL 500 MG PO TABS: 500.0000 mg | ORAL_TABLET | Freq: Four times a day (QID) | ORAL | Status: DC | PRN

## 2014-02-08 MED ORDER — PANTOPRAZOLE SODIUM 40 MG PO TBEC
40.0000 mg | DELAYED_RELEASE_TABLET | Freq: Every day | ORAL | Status: DC
  Administered 2014-02-08 – 2014-02-10 (×3): 40 mg via ORAL
  Filled 2014-02-08 (×3): qty 1

## 2014-02-08 MED ORDER — BISACODYL 5 MG PO TBEC: 5.0000 mg | DELAYED_RELEASE_TABLET | Freq: Every day | ORAL | Status: DC | PRN

## 2014-02-08 MED ORDER — ENOXAPARIN SODIUM 40 MG/0.4ML ~~LOC~~ SOLN
40.0000 mg | SUBCUTANEOUS | Status: DC
  Administered 2014-02-08 – 2014-02-09 (×2): 40 mg via SUBCUTANEOUS
  Filled 2014-02-08 (×4): qty 0.4

## 2014-02-08 MED ORDER — MORPHINE SULFATE 4 MG/ML IJ SOLN
6.0000 mg | Freq: Once | INTRAMUSCULAR | Status: AC
  Administered 2014-02-08: 6 mg via INTRAVENOUS
  Filled 2014-02-08: qty 2

## 2014-02-08 MED ORDER — HYDROCODONE-ACETAMINOPHEN 5-325 MG PO TABS
1.0000 | ORAL_TABLET | ORAL | Status: DC | PRN
  Administered 2014-02-08 – 2014-02-09 (×3): 2 via ORAL
  Administered 2014-02-09: 1 via ORAL
  Administered 2014-02-09 – 2014-02-10 (×3): 2 via ORAL
  Filled 2014-02-08 (×2): qty 2
  Filled 2014-02-08: qty 1
  Filled 2014-02-08 (×5): qty 2

## 2014-02-08 MED ORDER — ACETAMINOPHEN 325 MG PO TABS: 650.0000 mg | ORAL_TABLET | Freq: Four times a day (QID) | ORAL | Status: DC | PRN

## 2014-02-08 MED ORDER — ACETAMINOPHEN 650 MG RE SUPP: 650.0000 mg | Freq: Four times a day (QID) | RECTAL | Status: DC | PRN

## 2014-02-08 NOTE — ED Notes (Signed)
Pt states lower back pain for two weeks.  Pt states she has some issues with short term memory and is not able to remember everything.  Pt states multiple recent X-rays and MRI for same without a known cause

## 2014-02-08 NOTE — ED Notes (Signed)
Attempted to call report unsuccessful 

## 2014-02-08 NOTE — Consult Note (Signed)
Reason for Consult:Bilateral sacral insufficiency fractures Referring Physician: Dr, [name] Fields is an 80 y.o. female.  HPI: [name] Fields has an approximately 2-3 week history of lower back/sacral pain which began after a fall. She has had progressively increasing pain and was evaluated in our office by Dr. Nelva Bush who ordered an MRI. This was supposed to occur today but her pain intensified so much that she presented to the Emergency Room with severe pain. She has not had any lower extremity weakness or paresthesia Her MRI here today showed Bilateral sacral insufficiency fractures  Past Medical History  Diagnosis Date  . Coronary artery disease     1st diag. stenosis of 70%  . Hyperlipidemia   . Hypertension   . Aortic stenosis     mild  . Chest pain   . Osteoporosis   . Spinal stenosis   . Shingles     Past Surgical History  Procedure Laterality Date  . Cardiac catheterization  10/18/2007    mild to moderate CAD,aggressive medical therapy    History reviewed. No pertinent family history.  Social History:  reports that she has never smoked. She does not have any smokeless tobacco history on file. She reports that she does not drink alcohol or use illicit drugs.  Allergies: No Known Allergies  Medications: I have reviewed the patient's current medications.  Results for orders placed during the hospital encounter of 02/08/14 (from the past 48 hour(s))  CBC WITH DIFFERENTIAL     Status: Abnormal   Collection Time    02/08/14  6:50 AM      Result Value Ref Range   WBC 10.2  4.0 - 10.5 K/uL   RBC 4.58  3.87 - 5.11 MIL/uL   Hemoglobin 13.0  12.0 - 15.0 g/dL   HCT 38.7  36.0 - 46.0 %   MCV 84.5  78.0 - 100.0 fL   MCH 28.4  26.0 - 34.0 pg   MCHC 33.6  30.0 - 36.0 g/dL   RDW 13.0  11.5 - 15.5 %   Platelets 279  150 - 400 K/uL   Neutrophils Relative % 68  43 - 77 %   Neutro Abs 7.0  1.7 - 7.7 K/uL   Lymphocytes Relative 21  12 - 46 %   Lymphs Abs 2.1  0.7 - 4.0 K/uL    Monocytes Relative 11  3 - 12 %   Monocytes Absolute 1.1 (*) 0.1 - 1.0 K/uL   Eosinophils Relative 0  0 - 5 %   Eosinophils Absolute 0.0  0.0 - 0.7 K/uL   Basophils Relative 0  0 - 1 %   Basophils Absolute 0.0  0.0 - 0.1 K/uL  BASIC METABOLIC PANEL     Status: Abnormal   Collection Time    02/08/14  6:50 AM      Result Value Ref Range   Sodium 139  137 - 147 mEq/L   Potassium 3.8  3.7 - 5.3 mEq/L   Chloride 102  96 - 112 mEq/L   CO2 24  19 - 32 mEq/L   Glucose, Bld 96  70 - 99 mg/dL   BUN 21  6 - 23 mg/dL   Creatinine, Ser 0.56  0.50 - 1.10 mg/dL   Calcium 8.8  8.4 - 10.5 mg/dL   GFR calc non Af Amer 82 (*) >90 mL/min   GFR calc Af Amer >90  >90 mL/min   Comment: (NOTE)     The eGFR has been calculated  using the CKD EPI equation.     This calculation has not been validated in all clinical situations.     eGFR's persistently <90 mL/min signify possible Chronic Kidney     Disease.  URINALYSIS, ROUTINE W REFLEX MICROSCOPIC     Status: Abnormal   Collection Time    02/08/14  8:03 AM      Result Value Ref Range   Color, Urine YELLOW  YELLOW   APPearance HAZY (*) CLEAR   Specific Gravity, Urine 1.010  1.005 - 1.030   pH 7.5  5.0 - 8.0   Glucose, UA NEGATIVE  NEGATIVE mg/dL   Hgb urine dipstick NEGATIVE  NEGATIVE   Bilirubin Urine NEGATIVE  NEGATIVE   Ketones, ur NEGATIVE  NEGATIVE mg/dL   Protein, ur NEGATIVE  NEGATIVE mg/dL   Urobilinogen, UA 0.2  0.0 - 1.0 mg/dL   Nitrite NEGATIVE  NEGATIVE   Leukocytes, UA NEGATIVE  NEGATIVE   Comment: MICROSCOPIC NOT DONE ON URINES WITH NEGATIVE PROTEIN, BLOOD, LEUKOCYTES, NITRITE, OR GLUCOSE <1000 mg/dL.    Mr Lumbar Spine Wo Contrast  02/08/2014   CLINICAL DATA:  Low back pain, 2 weeks duration.  Unable to walk.  EXAM: MRI LUMBAR SPINE WITHOUT CONTRAST  TECHNIQUE: Multiplanar, multisequence MR imaging of the lumbar spine was performed. No intravenous contrast was administered.  COMPARISON:  Radiography 03/09/2013.  MRI 01/21/2004.   FINDINGS: There are sacral insufficiency fractures, more extensive on the left than the right. This is quite likely because of the acute syndrome.  There is curvature convex to the left with the apex at L1-2. T11-12 is normal. There is a shallow disc herniation at T12-L1 with slight caudal down turning. No neural compression. Conus tip is at this level.  L1-2: Disc degeneration more pronounced on the right with disc space narrowing, endplate osteophytes and bulging of the disc. Mild facet hypertrophy on the right. Mild stenosis of the right lateral recess without gross neural compression.  L2-3: Retrolisthesis of 3 mm. Endplate osteophytes and bulging of the disc. Mild facet and ligamentous hypertrophy. Mild stenosis of both lateral recesses.  L3-4: Retrolisthesis of 2 mm. Circumferential bulging of the disc. Facet and ligamentous hypertrophy. Moderate stenosis of the canal and lateral recesses.  L4-5: Advanced bilateral facet arthropathy with anterolisthesis of 1 cm. Pseudo disc herniation. Severe stenosis of the canal, lateral recesses and foramina at this level that could cause neural compression.  L5-S1: Chronic disc degeneration with endplate osteophytes and bulging of the disc. Bilateral facet hypertrophy. Stenosis of both subarticular lateral recesses without definite neural compression.  IMPRESSION: Acute bilateral sacral insufficiency fractures, worse on the left than the right, likely the cause of the acute pain syndrome.  Scoliosis convex to the left. Complex multi-level degenerative disease, generally worsened since the study of 2005.  Bilateral lateral recess stenosis at L2-3 and L3-4 that could possibly cause neural compression.  Severe multifactorial stenosis at L4-5 because of facet arthropathy with 1 cm of anterolisthesis and degenerative disc disease.   Electronically Signed   By: Nelson Chimes M.D.   On: 02/08/2014 10:16    ROS Blood pressure 135/51, pulse 66, temperature 98.1 F (36.7 C),  temperature source Oral, resp. rate 20, height 5' (1.524 m), weight 111 lb (50.349 kg), SpO2 93.00%. Physical Exam Tender lower lumbar and sacral area No pain on ROM hip No tenderness on hips  Assessment/Plan: Sacral insufficiency fractures- Fortunately this is a non-operative fracture which should heal uneventfully. These are very painful at their onset  and get progressively better after the first week. She may be WBAT with physical therapy, which should start tomorrow. May need short term SNF stay. Follow -up in office in 2 weeks with Dr. Suella Broad who saw her initially in the office  Dent Plantz V 02/08/2014, 10:47 PM

## 2014-02-08 NOTE — ED Notes (Signed)
Pt's family states pt has a new onset of not being able to walk for a week; pt has an MRI scheduled today at 7:30am; family states pt has no control of bladder at night; pt has uncontrolled pain in lower back

## 2014-02-08 NOTE — H&P (Signed)
[facility] History and Physical  [name] Fields ZOX:[id number] DOB: Jul 10, 1925 DOA: 02/08/2014  Referring physician: EDP PCP: Ginette Otto, MD   Chief Complaint: Worsening hip pain and difficulty walking  HPI: [name] Fields is a 80 y.o. female with history of spinal stenosis, osteoporosis, hypertension, mild aortic stenosis, CAD Presents with above complaints. She states that about 2 weeks ago she fell after she went out to collect her mail. She was able to get up that day and did okay but subsequently began having hip pain and followed up with her PCP was prescribed pain meds. The pain was persisting so she followed up with another M.D. and was given "a shot". She states none of this helped and she began having difficulty walking>> required a cane initially and that progressed to where she had to have a walker and worsened still and she was unable to walk at all due to the severe pain and now requiring a wheelchair. She states that her PCP as scheduled for her to have an MRI early this a.m., but she came to the ED earlier d/t the worsening symptoms. MRI of her lumbar spine showed acute bilateral sacral insufficiency fractures worse on the left than the right, scoliosis convex to the left with complex multilevel degenerative disease and bilateral lateral recess stenosis at L2-3 and L3-4 that could possibly cause neural compression. Orthopedics was consulted per EDP and admission to medicine requested. She admits to urinary frequency, denies fevers .UA done in ED was negative.  Review of Systems The patient denies anorexia, fever, weight loss, vision loss, decreased hearing, hoarseness, chest pain, syncope, dyspnea on exertion, peripheral edema, hemoptysis, abdominal pain, melena, hematochezia, severe indigestion/heartburn, hematuria, genital sores, suspicious skin lesions, transient blindness, depression, unusual weight change, abnormal bleeding, enlarged lymph nodes, angioedema,  and breast masses.   Past Medical History  Diagnosis Date  . Coronary artery disease     1st diag. stenosis of 70%  . Hyperlipidemia   . Hypertension   . Aortic stenosis     mild  . Chest pain   . Osteoporosis   . Spinal stenosis   . Shingles    Past Surgical History  Procedure Laterality Date  . Cardiac catheterization  10/18/2007    mild to moderate CAD,aggressive medical therapy   Social History:  reports that she has never smoked. She does not have any smokeless tobacco history on file. She reports that she does not drink alcohol or use illicit drugs.  No Known Allergies  History reviewed. No pertinent family history.   Prior to Admission medications   Medication Sig Start Date End Date Taking? Authorizing Provider  aspirin 81 MG tablet Take 81 mg by mouth daily.     Yes Historical Provider, MD  HYDROcodone-acetaminophen (NORCO/VICODIN) 5-325 MG per tablet Take 1 tablet by mouth every 4 (four) hours as needed for moderate pain.  02/07/14  Yes Historical Provider, MD  metoprolol tartrate (LOPRESSOR) 25 MG tablet Take 25 mg by mouth daily.   Yes Historical Provider, MD   Physical Exam: Filed Vitals:   02/08/14 1250  BP: 181/61  Pulse: 61  Temp: 97.6 F (36.4 C)  Resp: 18    BP 181/61  Pulse 61  Temp(Src) 97.6 F (36.4 C) (Oral)  Resp 18  Ht 5' (1.524 m)  Wt 50.349 kg (111 lb)  BMI 21.68 kg/m2  SpO2 99% Constitutional: Vital signs reviewed.  Patient is a well-developed and well-nourished  in no acute distress and cooperative with exam. Alert and  oriented x3.  Head: Normocephalic and atraumatic Ear: TM normal bilaterally Nose: No erythema or drainage noted.  Turbinates normal Mouth: no erythema or exudates, MMM Eyes: PERRL, EOMI, conjunctivae normal, No scleral icterus.  Neck: Supple, Trachea midline normal ROM, No JVD, mass, thyromegaly, or carotid bruit present.  Cardiovascular: RRR, S1 normal, S2 normal, no MRG, pulses symmetric and intact  bilaterally Pulmonary/Chest: normal respiratory effort, CTAB, no wheezes, rales, or rhonchi Abdominal: Soft. Non-tender, non-distended, bowel sounds are normal, no masses, organomegaly, or guarding present.  GU: no CVA tenderness  extremities: No cyanosis and no edema. ROM limited by pain  Neurological: A&O x3, Strength 4/5 and symmetric bilaterally, cranial nerve II-XII are grossly intact, no focal motor deficit, sensory grossly intact to light touch bilaterally.  Skin: Warm, dry and intact. No rash, cyanosis, or clubbing.  Psychiatric: Normal mood and affect. speech and behavior is normal. Judgment and thought content normal. Cognition and memory are normal.                Labs on Admission:  Basic Metabolic Panel:  Recent Labs Lab 02/08/14 0650  NA 139  K 3.8  CL 102  CO2 24  GLUCOSE 96  BUN 21  CREATININE 0.56  CALCIUM 8.8   Liver Function Tests: No results found for this basename: AST, ALT, ALKPHOS, BILITOT, PROT, ALBUMIN,  in the last 168 hours No results found for this basename: LIPASE, AMYLASE,  in the last 168 hours No results found for this basename: AMMONIA,  in the last 168 hours CBC:  Recent Labs Lab 02/08/14 0650  WBC 10.2  NEUTROABS 7.0  HGB 13.0  HCT 38.7  MCV 84.5  PLT 279   Cardiac Enzymes: No results found for this basename: CKTOTAL, CKMB, CKMBINDEX, TROPONINI,  in the last 168 hours  BNP (last 3 results) No results found for this basename: PROBNP,  in the last 8760 hours CBG: No results found for this basename: GLUCAP,  in the last 168 hours  Radiological Exams on Admission: Mr Lumbar Spine Wo Contrast  02/08/2014   CLINICAL DATA:  Low back pain, 2 weeks duration.  Unable to walk.  EXAM: MRI LUMBAR SPINE WITHOUT CONTRAST  TECHNIQUE: Multiplanar, multisequence MR imaging of the lumbar spine was performed. No intravenous contrast was administered.  COMPARISON:  Radiography 03/09/2013.  MRI 01/21/2004.  FINDINGS: There are sacral insufficiency  fractures, more extensive on the left than the right. This is quite likely because of the acute syndrome.  There is curvature convex to the left with the apex at L1-2. T11-12 is normal. There is a shallow disc herniation at T12-L1 with slight caudal down turning. No neural compression. Conus tip is at this level.  L1-2: Disc degeneration more pronounced on the right with disc space narrowing, endplate osteophytes and bulging of the disc. Mild facet hypertrophy on the right. Mild stenosis of the right lateral recess without gross neural compression.  L2-3: Retrolisthesis of 3 mm. Endplate osteophytes and bulging of the disc. Mild facet and ligamentous hypertrophy. Mild stenosis of both lateral recesses.  L3-4: Retrolisthesis of 2 mm. Circumferential bulging of the disc. Facet and ligamentous hypertrophy. Moderate stenosis of the canal and lateral recesses.  L4-5: Advanced bilateral facet arthropathy with anterolisthesis of 1 cm. Pseudo disc herniation. Severe stenosis of the canal, lateral recesses and foramina at this level that could cause neural compression.  L5-S1: Chronic disc degeneration with endplate osteophytes and bulging of the disc. Bilateral facet hypertrophy. Stenosis of both subarticular lateral recesses  without definite neural compression.  IMPRESSION: Acute bilateral sacral insufficiency fractures, worse on the left than the right, likely the cause of the acute pain syndrome.  Scoliosis convex to the left. Complex multi-level degenerative disease, generally worsened since the study of 2005.  Bilateral lateral recess stenosis at L2-3 and L3-4 that could possibly cause neural compression.  Severe multifactorial stenosis at L4-5 because of facet arthropathy with 1 cm of anterolisthesis and degenerative disc disease.   Electronically Signed   By: Paulina FusiMark  Shogry M.D.   On: 02/08/2014 10:16      Assessment/Plan Active Problems:   Sacral fracture, closed -As discussed above, will admit for pain  management, -Orthopedics consulted per EDP>> await eval and recommendations -PTOT consult   Spinal stenosis/DJD (degenerative joint disease) -Pain management ,Ortho consulted as above   Hypertension -Continue outpatient medications History of CAD -She is chest pain-free, continue outpatient medications.     Code Status: Full Family Communication: None at bedside Disposition Plan: [facility]      Jiles Goya C  [facility] Pager [phone]. If 7PM-7AM, please contact night-coverage at [email or web address], password [email or web address] 02/08/2014, 6:06 PM  LOS: 0 days

## 2014-02-08 NOTE — ED Notes (Signed)
Patient is not in the room.  Has gone to scan.  Family member in the cafeteria.

## 2014-02-09 LAB — BASIC METABOLIC PANEL
BUN: 29 mg/dL — ABNORMAL HIGH (ref 6–23)
CHLORIDE: 102 meq/L (ref 96–112)
CO2: 25 mEq/L (ref 19–32)
CREATININE: 0.78 mg/dL (ref 0.50–1.10)
Calcium: 9.4 mg/dL (ref 8.4–10.5)
GFR calc Af Amer: 85 mL/min — ABNORMAL LOW (ref >90)
GFR calc non Af Amer: 74 mL/min — ABNORMAL LOW (ref >90)
GLUCOSE: 103 mg/dL — AB (ref 70–99)
POTASSIUM: 4.2 meq/L (ref 3.7–5.3)
Sodium: 138 mEq/L (ref 137–147)

## 2014-02-09 NOTE — Evaluation (Signed)
Physical Therapy Evaluation Patient Details Name: [name] PasserJacqueline Fields MRN: [id number] DOB: [birth year] Today's Date: 02/09/2014   History of Present Illness  [name] has an approximately 2-3 week history of lower back/sacral pain which began after a fall. She has had progressively increasing pain and Her MRI showed Bilateral sacral insufficiency fractures which are non-operative.  Clinical Impression  Pt admitted with B sacral insufficiency fractures. Pt currently with functional limitations due to the deficits listed below (see PT Problem List).  Pt will benefit from skilled PT to increase their independence and safety with mobility to allow discharge to the venue listed below. Pt is motivated to work with therapy and is agreeable to go to SNF for rehab to work towards returning to her independent prior level of function.     Follow Up Recommendations SNF    Equipment Recommendations  None recommended by PT    Recommendations for Other Services       Precautions / Restrictions Restrictions Weight Bearing Restrictions: Yes RLE Weight Bearing: Weight bearing as tolerated LLE Weight Bearing: Weight bearing as tolerated      Mobility  Bed Mobility Overal bed mobility: Needs Assistance Bed Mobility: Supine to Sit     Supine to sit: Min assist     General bed mobility comments: HOB elevated with minimal use of rail.  Transfers Overall transfer level: Needs assistance   Transfers: Sit to/from Stand Sit to Stand: Mod assist         General transfer comment: cueing for hand placement.  R LE with buckeling. Instruction on use of UE to unweight LE.  Ambulation/Gait Ambulation/Gait assistance: +2 safety/equipment;Mod assist Ambulation Distance (Feet): 4 Feet Assistive device: Rolling walker (2 wheeled) Gait Pattern/deviations: Antalgic;Decreased stance time - right;Decreased step length - left;Step-to pattern     General Gait Details: R LE wants to buckle with WB and  needed constant cues for use of UE.  Chair had to brought up behind her to sit.  Stairs            Wheelchair Mobility    Modified Rankin (Stroke Patients Only)       Balance Overall balance assessment: Needs assistance   Sitting balance-Leahy Scale: Good     Standing balance support: Bilateral upper extremity supported Standing balance-Leahy Scale: Poor                               Pertinent Vitals/Pain 7-8/10 sacrum- nurse gave meds prior to treatment.    Home Living Family/patient expects to be discharged to:: Private residence Living Arrangements: Alone Available Help at Discharge: Available PRN/intermittently;Family Type of Home: House Home Access: Level entry     Home Layout: One level Home Equipment: Walker - 2 wheels;Cane - single point;Wheelchair - manual      Prior Function Level of Independence: Independent         Comments: Independent until 3 weeks ago when she fell and progressed from cane to RW to w/c as pain progressed.     Hand Dominance   Dominant Hand: Right    Extremity/Trunk Assessment               Lower Extremity Assessment: RLE deficits/detail;LLE deficits/detail RLE Deficits / Details: limited by pain LLE Deficits / Details: limited by pain  Cervical / Trunk Assessment: Normal  Communication   Communication: No difficulties  Cognition Arousal/Alertness: Awake/alert Behavior During Therapy: WFL for tasks assessed/performed Overall Cognitive Status: Within Functional  Limits for tasks assessed                      General Comments General comments (skin integrity, edema, etc.): Pt motivated to work with therapy and pleasant despite high pain level at times.    Exercises        Assessment/Plan    PT Assessment Patient needs continued PT services  PT Diagnosis Difficulty walking;Acute pain   PT Problem List Pain;Decreased mobility;Decreased balance;Decreased coordination;Decreased  strength;Decreased range of motion;Decreased knowledge of use of DME  PT Treatment Interventions DME instruction;Gait training;Functional mobility training;Therapeutic activities;Therapeutic exercise   PT Goals (Current goals can be found in the Care Plan section) Acute Rehab PT Goals Patient Stated Goal: To go to rehab to get back to her PLOF PT Goal Formulation: With patient Time For Goal Achievement: 02/16/14 Potential to Achieve Goals: Good    Frequency Min 3X/week   Barriers to discharge        Co-evaluation PT/OT/SLP Co-Evaluation/Treatment: Yes   PT goals addressed during session: Mobility/safety with mobility;Proper use of DME         End of Session Equipment Utilized During Treatment: Gait belt Activity Tolerance: Patient limited by pain Patient left: in chair;with call bell/phone within reach Nurse Communication: Mobility status         Time: [id number] PT Time Calculation (min): 34 min   Charges:   PT Evaluation $Initial PT Evaluation Tier I: 1 Procedure PT Treatments $Therapeutic Activity: 8-22 mins   PT G Codes:          SMITH,KAREN LUBECK 02/09/2014, 11:53 AM

## 2014-02-09 NOTE — Evaluation (Signed)
Occupational Therapy Evaluation Patient Details Name: [name] Fields MRN: [id number] DOB: [birth year] Today's Date: 02/09/2014    History of Present Illness [name] has an approximately 2-3 week history of lower back/sacral pain which began after a fall. She has had progressively increasing pain and Her MRI showed Bilateral sacral insufficiency fractures which are non-operative.   Clinical Impression   Pt admitted with above. She demonstrates the below listed deficits and will benefit from continued OT to maximize safety and independence with BADLs.   Pt is very motivated.  Currently, she requires mod A for LB ADLs.  Anticipate good progress.  She lives alone and will need SNF level rehab before returning home alone.       Follow Up Recommendations  SNF    Equipment Recommendations  None recommended by OT    Recommendations for Other Services       Precautions / Restrictions Precautions Precautions: Fall Restrictions Weight Bearing Restrictions: Yes RLE Weight Bearing: Weight bearing as tolerated LLE Weight Bearing: Weight bearing as tolerated      Mobility Bed Mobility Overal bed mobility: Needs Assistance Bed Mobility: Supine to Sit     Supine to sit: Min assist     General bed mobility comments: HOB elevated with minimal use of rail.  Transfers Overall transfer level: Needs assistance Equipment used: Rolling walker (2 wheeled) Transfers: Sit to/from [employer]GI CorporationStand;Stand Pivot Transfers Sit to Stand: Mod assist Stand pivot transfers: Min assist;+2 safety/equipment       General transfer comment: cueing for hand placement.  R LE with buckeling. Instruction on use of UE to unweight LE.    Balance Overall balance assessment: Needs assistance Sitting-balance support: Feet supported Sitting balance-Leahy Scale: Good     Standing balance support: Single extremity supported Standing balance-Leahy Scale: Poor                              ADL Overall  ADL's : Needs assistance/impaired Eating/Feeding: Independent;Sitting   Grooming: Wash/dry hands;Wash/dry face;Oral care;Brushing hair;Set up;Sitting   Upper Body Bathing: Set up;Sitting   Lower Body Bathing: Moderate assistance;Sit to/from stand   Upper Body Dressing : Set up;Sitting   Lower Body Dressing: Moderate assistance;Sit to/from stand   Toilet Transfer: Minimal assistance;+2 for safety/equipment;Ambulation;BSC Toilet Transfer Details (indicate cue type and reason): Pt unsteady with increasing pain Rt. LE with weight bearing.  Pt reports LE has "given way" in the past due to the pain  Toileting- Clothing Manipulation and Hygiene: Minimal assistance;Sit to/from stand       Functional mobility during ADLs: Minimal assistance;+2 for safety/equipment;Rolling walker General ADL Comments: Pt able to access bil. feet with increased time.  Pt is very motivated     [employer]                     Perception     Praxis      Pertinent Vitals/Pain See vitals flow sheet     Hand Dominance Right   Extremity/Trunk Assessment     Lower Extremity Assessment Lower Extremity Assessment: RLE deficits/detail;LLE deficits/detail RLE Deficits / Details: limited by pain RLE: Unable to fully assess due to pain LLE Deficits / Details: limited by pain LLE: Unable to fully assess due to pain   Cervical / Trunk Assessment Cervical / Trunk Assessment: Normal   Communication Communication Communication: No difficulties   Cognition Arousal/Alertness: Awake/alert Behavior During Therapy: WFL for tasks assessed/performed Overall Cognitive Status: Within Functional Limits  for tasks assessed                     General Comments       Exercises       Shoulder Instructions      Home Living Family/patient expects to be discharged to:: Skilled nursing facility Living Arrangements: Alone Available Help at Discharge: Available PRN/intermittently;Family Type of Home:  House Home Access: Level entry     Home Layout: One level               Home Equipment: Walker - 2 wheels;Cane - single point;Wheelchair - manual          Prior Functioning/Environment Level of Independence: Independent        Comments: Independent until 3 weeks ago when she fell and progressed from cane to RW to w/c as pain progressed.    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) OT Goal Formulation: With patient Time For Goal Achievement: 02/16/14 Potential to Achieve Goals: Good ADL Goals Pt Will Perform Grooming: with min guard assist;standing Pt Will Perform Lower Body Bathing: with min guard assist;sit to/from stand Pt Will Perform Lower Body Dressing: with min guard assist;sit to/from stand Pt Will Transfer to Toilet: with min guard assist;ambulating;regular height toilet;bedside commode;grab bars Pt Will Perform Toileting - Clothing Manipulation and hygiene: with min guard assist;sit to/from stand  OT Frequency: Min 2X/week   Barriers to D/C: Decreased caregiver support          Co-evaluation PT/OT/SLP Co-Evaluation/Treatment: Yes Reason for Co-Treatment: For patient/therapist safety PT goals addressed during session: Mobility/safety with mobility;Proper use of DME OT goals addressed during session: ADL's and self-care      End of Session Equipment Utilized During Treatment: Rolling walker;Gait belt Nurse Communication: Mobility status  Activity Tolerance: Patient limited by pain Patient left:  in chair;with call bell/phone within reach   Time: 1048-1105 OT Time Calculation (min): 17 min Charges:  OT General Charges $OT Visit: 1 Procedure OT Evaluation $Initial OT Evaluation Tier I: 1 Procedure G-Codes:    [name] Hawkingonarpe, [name] Fields 02/09/2014, 2:40 PM

## 2014-02-09 NOTE — ED Provider Notes (Signed)
CSN: [id number]     Arrival date & time 02/08/14  0542 History   First MD Initiated Contact with Patient 02/08/14 [phone]     Chief Complaint  Patient presents with  . Back Pain     (Consider location/radiation/quality/duration/timing/severity/associated sxs/prior Treatment) HPI Patient presents to the emergency department with increasing lower back pain over the last 2 weeks.  The patient, states, that she had a fall several weeks ago.  She landed on her buttocks and has had increasing pain since that time.  Patient, states, that movement and palpation make the pain, worse.  Patient, states, that she's progressively had problems ambulating to the point where she can no longer ambulate.  Patient, states she was using a walker, but is no longer able to do so.  The patient denies chest pain, shortness of breath, nausea, vomiting, abdominal pain, dysuria, weakness, dizziness, numbness, fever, headache, blurred vision, rash, or syncope.  Patient, states she has had some increasing symptoms of the last 2 days.  She states, that she's had several episodes of bladder, incontinence at night Past Medical History  Diagnosis Date  . Coronary artery disease     1st diag. stenosis of 70%  . Hyperlipidemia   . Hypertension   . Aortic stenosis     mild  . Chest pain   . Osteoporosis   . Spinal stenosis   . Shingles    Past Surgical History  Procedure Laterality Date  . Cardiac catheterization  10/18/2007    mild to moderate CAD,aggressive medical therapy   History reviewed. No pertinent family history. History  Substance Use Topics  . Smoking status: Never Smoker   . Smokeless tobacco: Not on file  . Alcohol Use: No   OB History   Grav Para Term Preterm Abortions TAB SAB Ect Mult Living                 Review of Systems All other systems negative except as documented in the HPI. All pertinent positives and negatives as reviewed in the HPI.    Allergies  Review of patient's allergies  indicates no known allergies.  Home Medications   Prior to Admission medications   Medication Sig Start Date End Date Taking? Authorizing Provider  aspirin 81 MG tablet Take 81 mg by mouth daily.     Yes Historical Provider, MD  HYDROcodone-acetaminophen (NORCO/VICODIN) 5-325 MG per tablet Take 1 tablet by mouth every 4 (four) hours as needed for moderate pain.  02/07/14  Yes Historical Provider, MD  metoprolol tartrate (LOPRESSOR) 25 MG tablet Take 25 mg by mouth daily.   Yes Historical Provider, MD   BP 155/80  Pulse 74  Temp(Src) 97.3 F (36.3 C) (Oral)  Resp 20  Ht 5' (1.524 m)  Wt 111 lb (50.349 kg)  BMI 21.68 kg/m2  SpO2 99% Physical Exam  Nursing note and vitals reviewed. Constitutional: She is oriented to person, place, and time. She appears well-developed and well-nourished. No distress.  HENT:  Head: Normocephalic and atraumatic.  Mouth/Throat: Oropharynx is clear and moist.  Eyes: Pupils are equal, round, and reactive to light.  Neck: Normal range of motion. Neck supple.  Cardiovascular: Normal rate, regular rhythm and normal heart sounds.  Exam reveals no friction rub.   No murmur heard. Pulmonary/Chest: Effort normal and breath sounds normal. No respiratory distress.  Neurological: She is alert and oriented to person, place, and time. She has normal strength. No sensory deficit. She exhibits normal muscle tone. Coordination normal. GCS  eye subscore is 4. GCS verbal subscore is 5. GCS motor subscore is 6.  Reflex Scores:      Patellar reflexes are 2+ on the right side and 2+ on the left side.      Achilles reflexes are 2+ on the right side and 2+ on the left side.   ED Course  Procedures (including critical care time) Labs Review Labs Reviewed  CBC WITH DIFFERENTIAL - Abnormal; Notable for the following:    Monocytes Absolute 1.1 (*)    All other components within normal limits  BASIC METABOLIC PANEL - Abnormal; Notable for the following:    GFR calc non Af Amer  82 (*)    All other components within normal limits  URINALYSIS, ROUTINE W REFLEX MICROSCOPIC - Abnormal; Notable for the following:    APPearance HAZY (*)    All other components within normal limits  BASIC METABOLIC PANEL - Abnormal; Notable for the following:    Glucose, Bld 103 (*)    BUN 29 (*)    GFR calc non Af Amer 74 (*)    GFR calc Af Amer 85 (*)    All other components within normal limits  URINE CULTURE    Imaging Review Mr Lumbar Spine Wo Contrast  02/08/2014   CLINICAL DATA:  Low back pain, 2 weeks duration.  Unable to walk.  EXAM: MRI LUMBAR SPINE WITHOUT CONTRAST  TECHNIQUE: Multiplanar, multisequence MR imaging of the lumbar spine was performed. No intravenous contrast was administered.  COMPARISON:  Radiography 03/09/2013.  MRI 01/21/2004.  FINDINGS: There are sacral insufficiency fractures, more extensive on the left than the right. This is quite likely because of the acute syndrome.  There is curvature convex to the left with the apex at L1-2. T11-12 is normal. There is a shallow disc herniation at T12-L1 with slight caudal down turning. No neural compression. Conus tip is at this level.  L1-2: Disc degeneration more pronounced on the right with disc space narrowing, endplate osteophytes and bulging of the disc. Mild facet hypertrophy on the right. Mild stenosis of the right lateral recess without gross neural compression.  L2-3: Retrolisthesis of 3 mm. Endplate osteophytes and bulging of the disc. Mild facet and ligamentous hypertrophy. Mild stenosis of both lateral recesses.  L3-4: Retrolisthesis of 2 mm. Circumferential bulging of the disc. Facet and ligamentous hypertrophy. Moderate stenosis of the canal and lateral recesses.  L4-5: Advanced bilateral facet arthropathy with anterolisthesis of 1 cm. Pseudo disc herniation. Severe stenosis of the canal, lateral recesses and foramina at this level that could cause neural compression.  L5-S1: Chronic disc degeneration with  endplate osteophytes and bulging of the disc. Bilateral facet hypertrophy. Stenosis of both subarticular lateral recesses without definite neural compression.  IMPRESSION: Acute bilateral sacral insufficiency fractures, worse on the left than the right, likely the cause of the acute pain syndrome.  Scoliosis convex to the left. Complex multi-level degenerative disease, generally worsened since the study of 2005.  Bilateral lateral recess stenosis at L2-3 and L3-4 that could possibly cause neural compression.  Severe multifactorial stenosis at L4-5 because of facet arthropathy with 1 cm of anterolisthesis and degenerative disc disease.   Electronically Signed   By: Paulina FusiMark  Shogry M.D.   On: 02/08/2014 10:16   Patient will be admitted to the hospital for further evaluation and care.  The patient Ms. likely need placement in a rehabilitation facility due to her findings on the MRI and inability to ambulate.  I spoke with the [facility], and orthopedics, about the patient.  Patient, in stable here in the emergency department.  The patient and daughter given the plan and all questions were answered      Carlyle Dolly, PA-C 02/11/14 [phone]

## 2014-02-09 NOTE — Discharge Summary (Addendum)
Physician Discharge Summary  [name]:[id number] DOB: 1925/02/05 DOA: 02/08/2014  PCP: Ginette Otto, MD  Admit date: 02/08/2014 Discharge date: 02/10/2014  Time spent: >45 minutes    Recommendations for Outpatient Follow-up:  1. Continue to adjust pain medication to obtain optimal pain control    Discharge Diagnoses:  Principal Problem:   Sacral fracture, closed Active Problems:   Spinal stenosis   DJD (degenerative joint disease)   Hypertension   Sacral fracture   Discharge Condition: STABLE   Diet recommendation: low sodium  Filed Weights   02/08/14 0548 02/08/14 1353  Weight: 50.349 kg (111 lb) 50.349 kg (111 lb)    History of present illness:  [name] Fields is a 80 y.o. female with history of spinal stenosis, osteoporosis, hypertension, mild aortic stenosis, CAD  Presents with above complaints. She states that about 2 weeks ago she fell after she went out to collect her mail. She was able to get up that day and did okay but subsequently began having hip pain and followed up with her PCP was prescribed pain meds. The pain was persisting so she followed up with another M.D. and was given "a shot". She states none of this helped and she began having difficulty walking>> required a cane initially and that progressed to where she had to have a walker and worsened still and she was unable to walk at all due to the severe pain and now requiring a wheelchair. She states that her PCP as scheduled for her to have an MRI early this a.m., but she came to the ED earlier d/t the worsening symptoms. MRI of her lumbar spine showed acute bilateral sacral insufficiency fractures worse on the left than the right, scoliosis convex to the left with complex multilevel degenerative disease and bilateral lateral recess stenosis at L2-3 and L3-4 that could possibly cause neural compression. Orthopedics was consulted per EDP and admission to medicine requested. She admits to  urinary frequency, denies fevers .UA done in ED was negative.   Hospital Course:   B/l Sacral Insufficiency fractures due to fall - ortho consulted- recommends pain control WBAT with PT - PT and OT recommend SNF and pt is agreeable - she lives at home and will not be able to manage alone- she has severe pain even getting out of bed - cont analgesics which include Naprosyn, Vicodin PRN and plain Tylenol  Spinal stenosis - See MRI report- no current nerve root compression but she does have severe stenosis  HTN - cont Metoprolol  Procedures:  none  Consultations:  ortho  Discharge Exam: Filed Vitals:   02/10/14 1447  BP: 135/56  Pulse: 76  Temp: 97.4 F (36.3 C)  Resp: 18    General: AAO x 3 no distress Cardiovascular: RRR, no murmurs Respiratory: CTA b/l   Discharge Instructions You were cared for by a hospitalist during your hospital stay. If you have any questions about your discharge medications or the care you received while you were in the hospital after you are discharged, you can call the unit and asked to speak with the hospitalist on call if the hospitalist that took care of you is not available. Once you are discharged, your primary care physician will handle any further medical issues. Please note that NO REFILLS for any discharge medications will be authorized once you are discharged, as it is imperative that you return to your primary care physician (or establish a relationship with a primary care physician if you do not have one) for  your aftercare needs so that they can reassess your need for medications and monitor your lab values.  Discharge Instructions   Diet - low sodium heart healthy    Complete by:  As directed      Diet - low sodium heart healthy    Complete by:  As directed      Increase activity slowly    Complete by:  As directed      Increase activity slowly    Complete by:  As directed            No Known Allergies    Medication List          aspirin 81 MG tablet  Take 81 mg by mouth daily.     bisacodyl 5 MG EC tablet  Commonly known as:  DULCOLAX  Take 1 tablet (5 mg total) by mouth daily as needed for moderate constipation.     famotidine 40 MG tablet  Commonly known as:  PEPCID  Take 1 tablet (40 mg total) by mouth daily.     HYDROcodone-acetaminophen 5-325 MG per tablet  Commonly known as:  NORCO/VICODIN  Take 1-2 tablets by mouth every 4 (four) hours as needed for moderate pain or severe pain.     metoprolol tartrate 25 MG tablet  Commonly known as:  LOPRESSOR  Take 25 mg by mouth daily.     naproxen 375 MG tablet  Commonly known as:  NAPROSYN  Take 1 tablet (375 mg total) by mouth 2 (two) times daily with a meal.     senna 8.6 MG Tabs tablet  Commonly known as:  SENOKOT  Take 1 tablet (8.6 mg total) by mouth daily.        The results of significant diagnostics from this hospitalization (including imaging, microbiology, ancillary and laboratory) are listed below for reference.    Significant Diagnostic Studies: Mr Lodema PilotBrain W Wo Contrast  02/02/2014   CLINICAL DATA:  Gait abnormality. Memory loss and balance loss for 3 months. Possible TIA.  EXAM: MRI HEAD WITHOUT AND WITH CONTRAST  TECHNIQUE: Multiplanar, multiecho pulse sequences of the brain and surrounding structures were obtained without and with intravenous contrast.  CONTRAST:  10mL MULTIHANCE GADOBENATE DIMEGLUMINE 529 MG/ML IV SOLN  COMPARISON:  Head CT 08/27/2013  FINDINGS: There is no evidence of acute infarct, intracranial hemorrhage, mass, midline shift, or extra-axial fluid collection. There is mild to moderate cerebral atrophy, likely within normal limits for age. Patchy and confluent T2 hyperintensities are present in the periventricular white matter and pons as well as cerebellar peduncles, nonspecific but compatible with moderate chronic small vessel ischemic disease. There is no abnormal enhancement.  Prior bilateral cataract extraction is  noted. Paranasal sinuses are clear. There is trace left mastoid fluid. Major intracranial vascular flow voids are preserved.  IMPRESSION: 1. No evidence of acute intracranial abnormality or mass. 2. Moderate chronic small vessel ischemic disease.   Electronically Signed   By: Sebastian AcheAllen  Grady   On: 02/02/2014 15:29   Mr Lumbar Spine Wo Contrast  02/08/2014   CLINICAL DATA:  Low back pain, 2 weeks duration.  Unable to walk.  EXAM: MRI LUMBAR SPINE WITHOUT CONTRAST  TECHNIQUE: Multiplanar, multisequence MR imaging of the lumbar spine was performed. No intravenous contrast was administered.  COMPARISON:  Radiography 03/09/2013.  MRI 01/21/2004.  FINDINGS: There are sacral insufficiency fractures, more extensive on the left than the right. This is quite likely because of the acute syndrome.  There is curvature convex to the  left with the apex at L1-2. T11-12 is normal. There is a shallow disc herniation at T12-L1 with slight caudal down turning. No neural compression. Conus tip is at this level.  L1-2: Disc degeneration more pronounced on the right with disc space narrowing, endplate osteophytes and bulging of the disc. Mild facet hypertrophy on the right. Mild stenosis of the right lateral recess without gross neural compression.  L2-3: Retrolisthesis of 3 mm. Endplate osteophytes and bulging of the disc. Mild facet and ligamentous hypertrophy. Mild stenosis of both lateral recesses.  L3-4: Retrolisthesis of 2 mm. Circumferential bulging of the disc. Facet and ligamentous hypertrophy. Moderate stenosis of the canal and lateral recesses.  L4-5: Advanced bilateral facet arthropathy with anterolisthesis of 1 cm. Pseudo disc herniation. Severe stenosis of the canal, lateral recesses and foramina at this level that could cause neural compression.  L5-S1: Chronic disc degeneration with endplate osteophytes and bulging of the disc. Bilateral facet hypertrophy. Stenosis of both subarticular lateral recesses without definite  neural compression.  IMPRESSION: Acute bilateral sacral insufficiency fractures, worse on the left than the right, likely the cause of the acute pain syndrome.  Scoliosis convex to the left. Complex multi-level degenerative disease, generally worsened since the study of 2005.  Bilateral lateral recess stenosis at L2-3 and L3-4 that could possibly cause neural compression.  Severe multifactorial stenosis at L4-5 because of facet arthropathy with 1 cm of anterolisthesis and degenerative disc disease.   Electronically Signed   By: Paulina Fusi M.D.   On: 02/08/2014 10:16   US Carotid Bilateral  02/02/2014   CLINICAL DATA:  TIA symptoms  EXAM: BILATERAL CAROTID DUPLEX ULTRASOUND  TECHNIQUE: Wallace Cullens scale imaging, color Doppler and duplex ultrasound were performed of bilateral carotid and vertebral arteries in the neck.  COMPARISON:  None.  FINDINGS: Criteria: Quantification of carotid stenosis is based on velocity parameters that correlate the residual internal carotid diameter with NASCET-based stenosis levels, using the diameter of the distal internal carotid lumen as the denominator for stenosis measurement.  The following velocity measurements were obtained:  RIGHT  ICA:  66/15 cm/sec  CCA:  90/9 cm/sec  SYSTOLIC ICA/CCA RATIO:  0.73  DIASTOLIC ICA/CCA RATIO:  1.64  ECA:  71 cm/sec  LEFT  ICA:  75/14 cm/sec  CCA:  87/7 cm/sec  SYSTOLIC ICA/CCA RATIO:  0.87  DIASTOLIC ICA/CCA RATIO:  1.9  ECA:  59 cm/sec  RIGHT CAROTID ARTERY: Very mild atherosclerotic plaque is noted. The waveforms, velocities and flow velocity ratios show no evidence of focal hemodynamically significant stenosis.  RIGHT VERTEBRAL ARTERY:  Antegrade  LEFT CAROTID ARTERY: Mild atherosclerotic plaque is noted. The waveforms, velocities and flow velocity ratios show no evidence of focal hemodynamically significant stenosis.  LEFT VERTEBRAL ARTERY:  Antegrade  IMPRESSION: No evidence of focal carotid stenosis.   Electronically Signed   By: Alcide Clever  M.D.   On: 02/02/2014 13:46    Microbiology: Recent Results (from the past 240 hour(s))  URINE CULTURE     Status: None   Collection Time    02/08/14  8:03 AM      Result Value Ref Range Status   Specimen Description URINE, RANDOM   Final   Special Requests NONE   Final   Culture  Setup Time     Final   Value: 02/08/2014 09:25     Performed at [facility]   Colony Count PENDING   Incomplete   Culture     Final   Value: STAPHYLOCOCCUS SPECIES (  COAGULASE NEGATIVE)     Note: RIFAMPIN AND GENTAMICIN SHOULD NOT BE USED AS SINGLE DRUGS FOR TREATMENT OF STAPH INFECTIONS.     Performed at [facility]   Report Status PENDING   Incomplete     Labs: Basic Metabolic Panel:  Recent Labs Lab 02/08/14 0650 02/09/14 0315  NA 139 138  K 3.8 4.2  CL 102 102  CO2 24 25  GLUCOSE 96 103*  BUN 21 29*  CREATININE 0.56 0.78  CALCIUM 8.8 9.4   Liver Function Tests: No results found for this basename: AST, ALT, ALKPHOS, BILITOT, PROT, ALBUMIN,  in the last 168 hours No results found for this basename: LIPASE, AMYLASE,  in the last 168 hours No results found for this basename: AMMONIA,  in the last 168 hours CBC:  Recent Labs Lab 02/08/14 0650  WBC 10.2  NEUTROABS 7.0  HGB 13.0  HCT 38.7  MCV 84.5  PLT 279   Cardiac Enzymes: No results found for this basename: CKTOTAL, CKMB, CKMBINDEX, TROPONINI,  in the last 168 hours BNP: BNP (last 3 results) No results found for this basename: PROBNP,  in the last 8760 hours CBG: No results found for this basename: GLUCAP,  in the last 168 hours     Signed:  [facility] IncRIZWAN,SAIMA  [facility] 02/10/2014, 4:23 PM

## 2014-02-10 DIAGNOSIS — S322XXA Fracture of coccyx, initial encounter for closed fracture: Secondary | ICD-10-CM

## 2014-02-10 DIAGNOSIS — M48061 Spinal stenosis, lumbar region without neurogenic claudication: Secondary | ICD-10-CM

## 2014-02-10 DIAGNOSIS — S3210XA Unspecified fracture of sacrum, initial encounter for closed fracture: Secondary | ICD-10-CM

## 2014-02-10 DIAGNOSIS — I10 Essential (primary) hypertension: Secondary | ICD-10-CM

## 2014-02-10 MED ORDER — HYDROCODONE-ACETAMINOPHEN 5-325 MG PO TABS: 1.0000 | ORAL_TABLET | ORAL | Status: DC | PRN

## 2014-02-10 MED ORDER — BISACODYL 5 MG PO TBEC: 5.0000 mg | DELAYED_RELEASE_TABLET | Freq: Every day | ORAL | Status: DC | PRN

## 2014-02-10 MED ORDER — NAPROXEN 375 MG PO TABS: 375.0000 mg | ORAL_TABLET | Freq: Two times a day (BID) | ORAL | Status: DC

## 2014-02-10 MED ORDER — FAMOTIDINE 40 MG PO TABS: 40.0000 mg | ORAL_TABLET | Freq: Every day | ORAL | Status: DC

## 2014-02-10 MED ORDER — SENNA 8.6 MG PO TABS: 1.0000 | ORAL_TABLET | Freq: Every day | ORAL | Status: DC

## 2014-02-10 NOTE — Progress Notes (Signed)
Clinical Social Work Department CLINICAL SOCIAL WORK PLACEMENT NOTE 02/10/2014  Patient:  [name] [name],[name] [name]  Account Number:  [id number] Admit date:  02/08/2014  Clinical Social Worker:  Leron CroakASSANDRA ALLEN, CLINICAL SOCIAL WORKER  Date/time:  02/10/2014 01:17 PM  Clinical Social Work is seeking post-discharge placement for this patient at the following level of care:   SKILLED NURSING   (*CSW will update this form in Epic as items are completed)  02/10/2014  Patient/family provided with [facility] Department of Clinical Social Work's list of facilities offering this level of care within the geographic area requested by the patient (or if unable, by the patient's family).  02/10/2014  Patient/family informed of their freedom to choose among providers that offer the needed level of care, that participate in Medicare, Medicaid or managed care program needed by the patient, have an available bed and are willing to accept the patient.  02/10/2014  Patient/family informed of [facility]' ownership interest in [facility], as well as of the fact that they are under no obligation to receive care at this facility.  PASARR submitted to EDS on 02/10/2014 PASARR number received on 02/10/2014  FL2 transmitted to all facilities in geographic area requested by pt/family on  02/10/2014 FL2 transmitted to all facilities within larger geographic area on 02/10/2014  Patient informed that his/her managed care company has contracts with or will negotiate with  certain facilities, including the following:     Patient/family informed of bed offers received:   Patient chooses bed at  Physician recommends and patient chooses bed at    Patient to be transferred to  on   Patient to be transferred to facility by  Patient and family notified of transfer on  Name of family member notified:    The following physician request were entered in Epic:   Additional Comments:    Cassandra Janelle FloorAllen LCSWA   [facility]  4N 1[address]16;  6N1-16 Phone: [phone]

## 2014-02-10 NOTE — Progress Notes (Signed)
[name] PasserJacqueline [name] to be D/C'd [facility]illed nursing facility: [facility] per MD order.  Discussed with the patient and all questions fully answered.  VSS, Skin clean, dry and intact without evidence of skin break down, no evidence of skin tears noted. IV catheter discontinued intact. Site without signs and symptoms of complications. Dressing and pressure applied.  SNF packet given to EMS.  Prescription for Vicodin included in packet.  Alll questions fully answered.   Patient instructed to return to ED, call 911, or call MD for any changes in condition.   [facility] escorted via stretcher and [facility]   Burt EkCook, Taylor D 02/10/2014 4:10 PM

## 2014-02-10 NOTE — Progress Notes (Signed)
Clinical Social Work Department BRIEF PSYCHOSOCIAL ASSESSMENT 02/10/2014  Patient:  [name],[name]     Account Number:  [id number]     Admit date:  02/08/2014  Clinical Social Worker:  Leron CroakALLEN,CASSANDRA, CLINICAL SOCIAL WORKER  Date/Time:  02/10/2014 01:07 PM  Referred by:  Physician  Date Referred:  02/09/2014 Referred for  SNF Placement   Other Referral:   Interview type:  Patient Other interview type:   CSW also spoke with Pt's daughter and grandson that was in the room    PSYCHOSOCIAL DATA Living Status:  ALONE Admitted from facility:   Level of care:   Primary support name:  Bedelia PersonJane [name]  [phone] Primary support relationship to patient:  CHILD, ADULT Degree of support available:   Pt has a great support system from her family and friends.    CURRENT CONCERNS Current Concerns  Post-Acute Placement   Other Concerns:    SOCIAL WORK ASSESSMENT / PLAN CSW introduced self and reason for assessment. Pt was aware of the need for SNF for rehabilitation. CSW also introduced self to other family at the bedside. CSW explained the placement process and assess patient support system prrior to admission, as well post d/c from SNF facility. Pt stated that she is "blessed with a great family that is there for her." Pt was in good spirits. CSW was given permission to search in [place] andinformed Pt and family that as soon as offers were to become available, then CSW will bring them to Pt for decision making and that the Pt is up for d/c (per MD) today. Pt and daughter's choices are 1. [facility] and 2. [facility].   Assessment/plan status:  [facility] to [facility] Other assessment/ plan:   Information/referral to community resources:   CSW [place]    PATIENT'S/FAMILY'S RESPONSE TO PLAN OF CARE: Pt was a joy to speak with and was in good spirits. Pt was agreeable to rehab and looks forward to going  back home after her rehabilitation will continue to follow Pt and family and assist with d.c today.       Leron Croakassandra Allen [facility]WA  [facility]  4N 1[address]16;  [phone] Phone: [phone]

## 2014-02-11 LAB — URINE CULTURE: Colony Count: 80000

## 2014-02-11 NOTE — ED Provider Notes (Signed)
Medical screening examination/treatment/procedure(s) were performed by non-physician practitioner and as supervising physician I was immediately available for consultation/collaboration.   EKG Interpretation None        Laray AngerKathleen M McManus, DO 02/11/14 2146

## 2014-02-13 NOTE — Progress Notes (Signed)
PT evaluation addendum Late entry for g-codes   02/09/14 1150  PT Time Calculation  PT Start Time 1032  PT Stop Time 1106  PT Time Calculation (min) 34 min  PT G-Codes **NOT FOR INPATIENT CLASS**  Functional Assessment Tool Used clinical judgement/chart review  Functional Limitation Mobility: Walking and moving around  Mobility: Walking and Moving Around Current Status (G9562(G8978) CK  Mobility: Walking and Moving Around Goal Status (Z3086(G8979) CI  PT General Charges  $$ ACUTE PT VISIT 1 Procedure  PT Evaluation  $Initial PT Evaluation Tier I 1 Procedure  PT Treatments  $Therapeutic Activity 8-22 mins  02/13/2014 Gandys BeachMargie Moton, PT [phone]

## 2014-02-13 NOTE — Progress Notes (Signed)
Occupational Therapy Evaluation Addendum:   02/09/14 1400  OT G-codes **NOT FOR INPATIENT CLASS**  Functional Limitation Self care  Self Care Current Status (Z6109(G8987) CK  Self Care Goal Status (U0454(G8988) CI  Jeani HawkingWendi Conarpe, OTR/L [phone]

## 2014-02-14 ENCOUNTER — Non-Acute Institutional Stay (SKILLED_NURSING_FACILITY): Payer: Medicare Other | Admitting: Adult Health

## 2014-02-14 ENCOUNTER — Other Ambulatory Visit (HOSPITAL_COMMUNITY): Payer: Medicare Other

## 2014-02-14 ENCOUNTER — Encounter: Payer: Self-pay | Admitting: Adult Health

## 2014-02-14 ENCOUNTER — Other Ambulatory Visit: Payer: Self-pay | Admitting: *Deleted

## 2014-02-14 DIAGNOSIS — I10 Essential (primary) hypertension: Secondary | ICD-10-CM

## 2014-02-14 DIAGNOSIS — S322XXA Fracture of coccyx, initial encounter for closed fracture: Secondary | ICD-10-CM

## 2014-02-14 DIAGNOSIS — S3210XA Unspecified fracture of sacrum, initial encounter for closed fracture: Secondary | ICD-10-CM

## 2014-02-14 DIAGNOSIS — M47896 Other spondylosis, lumbar region: Secondary | ICD-10-CM

## 2014-02-14 DIAGNOSIS — M47817 Spondylosis without myelopathy or radiculopathy, lumbosacral region: Secondary | ICD-10-CM

## 2014-02-14 DIAGNOSIS — M48061 Spinal stenosis, lumbar region without neurogenic claudication: Secondary | ICD-10-CM

## 2014-02-14 MED ORDER — HYDROCODONE-ACETAMINOPHEN 5-325 MG PO TABS: ORAL_TABLET | ORAL | Status: DC

## 2014-02-14 NOTE — Telephone Encounter (Signed)
[facility] 

## 2014-02-14 NOTE — Progress Notes (Signed)
Patient ID: [name] Fields, female   DOB: 12-Jul-1925, 80 y.o.   MRN: [id number]               PROGRESS NOTE  DATE: 02/14/2014  FACILITY: [facility] Home [facility]  LEVEL OF CARE: SNF (31)  Acute Visit  CHIEF COMPLAINT:  Follow-up Hospitalization  HISTORY OF PRESENT ILLNESS:  This is an 80 year old female who fell at home and sustained a sacral fracture, closed. She has been admitted for a short-term rehabilitation.   REASSESSMENT OF ONGOING PROBLEM(S):  HTN: Pt 's HTN remains stable.  Denies CP, sob, DOE, pedal edema, headaches, dizziness or visual disturbances.  No complications from the medications currently being used.  Last BP : 134/74  CONSTIPATION: The constipation remains stable. No complications from the medications presently being used. Patient denies ongoing constipation, abdominal pain, nausea or vomiting.  SPINAL STENOSIS: Patient's spinal stenoses remains stable. Patient complains ongoing low back pain.  No numbness or tingling. No complications reported from the medications currently being used.  PAST MEDICAL HISTORY : Reviewed.  No changes/see problem list  CURRENT MEDICATIONS: Reviewed per MAR/see medication list  REVIEW OF SYSTEMS:  GENERAL: no change in appetite, no fatigue, no weight changes, no fever, chills or weakness RESPIRATORY: no cough, SOB, DOE, wheezing, hemoptysis CARDIAC: no chest pain, edema or palpitations GI: no abdominal pain, diarrhea, constipation, heart burn, nausea or vomiting  PHYSICAL EXAMINATION  GENERAL: no acute distress, normal body habitus EYES: conjunctivae normal, sclerae normal, normal eye lids NECK: supple, trachea midline, no neck masses, no thyroid tenderness, no thyromegaly LYMPHATICS: no LAN in the neck, no supraclavicular LAN RESPIRATORY: breathing is even & unlabored, BS CTAB CARDIAC: RRR, no murmur,no extra heart sounds, no edema GI: abdomen soft, normal BS, no masses, no tenderness, no  hepatomegaly, no splenomegaly EXTREMITIES: able to move all 4 extremities; + low back pain PSYCHIATRIC: the patient is alert & oriented to person, affect & behavior appropriate  LABS/RADIOLOGY: Labs reviewed: Basic Metabolic Panel:  Recent Labs  13/24/4000/06/01 1625 02/08/14 0650 02/09/14 0315  NA 141 139 138  K 3.4* 3.8 4.2  CL 103 102 102  CO2 23 24 25   GLUCOSE 102* 96 103*  BUN 10 21 29*  CREATININE 0.60 0.56 0.78  CALCIUM 8.8 8.8 9.4   CBC:  Recent Labs  08/27/13 1625 02/08/14 0650  WBC 9.9 10.2  NEUTROABS 7.2 7.0  HGB 12.3 13.0  HCT 37.7 38.7  MCV 87.3 84.5  PLT 296 279   CLINICAL DATA:  Low back pain, 2 weeks duration.  Unable to walk.   EXAM: MRI LUMBAR SPINE WITHOUT CONTRAST   TECHNIQUE: Multiplanar, multisequence MR imaging of the lumbar spine was performed. No intravenous contrast was administered.   COMPARISON:  Radiography 03/09/2013.  MRI 01/21/2004.   FINDINGS: There are sacral insufficiency fractures, more extensive on the left than the right. This is quite likely because of the acute syndrome.   There is curvature convex to the left with the apex at L1-2. T11-12 is normal. There is a shallow disc herniation at T12-L1 with slight caudal down turning. No neural compression. Conus tip is at this level.   L1-2: Disc degeneration more pronounced on the right with disc space narrowing, endplate osteophytes and bulging of the disc. Mild facet hypertrophy on the right. Mild stenosis of the right lateral recess without gross neural compression.   L2-3: Retrolisthesis of 3 mm. Endplate osteophytes and bulging of the disc. Mild facet and ligamentous hypertrophy.  Mild stenosis of both lateral recesses.   L3-4: Retrolisthesis of 2 mm. Circumferential bulging of the disc. Facet and ligamentous hypertrophy. Moderate stenosis of the canal and lateral recesses.   L4-5: Advanced bilateral facet arthropathy with anterolisthesis of 1 cm. Pseudo disc  herniation. Severe stenosis of the canal, lateral recesses and foramina at this level that could cause neural compression.   L5-S1: Chronic disc degeneration with endplate osteophytes and bulging of the disc. Bilateral facet hypertrophy. Stenosis of both subarticular lateral recesses without definite neural compression.   IMPRESSION: Acute bilateral sacral insufficiency fractures, worse on the left than the right, likely the cause of the acute pain syndrome.   Scoliosis convex to the left. Complex multi-level degenerative disease, generally worsened since the study of 2005.   Bilateral lateral recess stenosis at L2-3 and L3-4 that could possibly cause neural compression.   Severe multifactorial stenosis at L4-5 because of facet arthropathy with 1 cm of anterolisthesis and degenerative disc disease.   ASSESSMENT/PLAN:  Sacral fracture, closed - for rehabilitation Spinal stenosis - start Norco 5/325 mg 2 tabs by mouth every 6 a.m., and 10 a.m., 2 PM and 6 PM and 10 PM DJD - continue Naprosyn Hypertension - well controlled; continue metoprolol Constipation - continue Senokot   CPT CODE: [id number]  Ella BodoMonina Vargas - NP [facility] [phone]

## 2014-02-15 ENCOUNTER — Other Ambulatory Visit: Payer: Self-pay | Admitting: *Deleted

## 2014-02-15 MED ORDER — HYDROCODONE-ACETAMINOPHEN 5-325 MG PO TABS: ORAL_TABLET | ORAL | Status: DC

## 2014-02-21 ENCOUNTER — Non-Acute Institutional Stay (SKILLED_NURSING_FACILITY): Payer: Medicare Other | Admitting: Internal Medicine

## 2014-02-21 DIAGNOSIS — I10 Essential (primary) hypertension: Secondary | ICD-10-CM

## 2014-02-21 DIAGNOSIS — M48061 Spinal stenosis, lumbar region without neurogenic claudication: Secondary | ICD-10-CM

## 2014-02-21 DIAGNOSIS — S3210XA Unspecified fracture of sacrum, initial encounter for closed fracture: Secondary | ICD-10-CM

## 2014-02-21 DIAGNOSIS — S322XXA Fracture of coccyx, initial encounter for closed fracture: Secondary | ICD-10-CM

## 2014-02-21 DIAGNOSIS — K219 Gastro-esophageal reflux disease without esophagitis: Secondary | ICD-10-CM

## 2014-02-23 ENCOUNTER — Non-Acute Institutional Stay (SKILLED_NURSING_FACILITY): Payer: Medicare Other | Admitting: Adult Health

## 2014-02-23 DIAGNOSIS — I10 Essential (primary) hypertension: Secondary | ICD-10-CM

## 2014-02-23 DIAGNOSIS — M48061 Spinal stenosis, lumbar region without neurogenic claudication: Secondary | ICD-10-CM

## 2014-02-23 DIAGNOSIS — IMO0002 Reserved for concepts with insufficient information to code with codable children: Secondary | ICD-10-CM

## 2014-02-23 DIAGNOSIS — M47817 Spondylosis without myelopathy or radiculopathy, lumbosacral region: Secondary | ICD-10-CM

## 2014-02-23 DIAGNOSIS — K5900 Constipation, unspecified: Secondary | ICD-10-CM

## 2014-02-23 DIAGNOSIS — M47896 Other spondylosis, lumbar region: Secondary | ICD-10-CM

## 2014-02-23 DIAGNOSIS — S3210XD Unspecified fracture of sacrum, subsequent encounter for fracture with routine healing: Secondary | ICD-10-CM

## 2014-02-24 ENCOUNTER — Encounter: Payer: Self-pay | Admitting: Adult Health

## 2014-02-24 DIAGNOSIS — K5900 Constipation, unspecified: Secondary | ICD-10-CM | POA: Insufficient documentation

## 2014-02-24 NOTE — Progress Notes (Signed)
Patient ID: [name] Fields, female   DOB: 1925/06/20, 80 y.o.   MRN: [id number]              PROGRESS NOTE  DATE: 02/23/14  FACILITY: Nursing [facility]  LEVEL OF CARE: SNF (31)  Acute Visit  CHIEF COMPLAINT:  Discharge Notes  HISTORY OF PRESENT ILLNESS:  This is an 80 year old female who is for discharge home with Home health PT, OT and nursing. DME: 3-in-1 commode and rolling walker. She fell at home and sustained a sacral fracture, closed. Patient was admitted to this facility for short-term rehabilitation after the patient's recent hospitalization.  Patient has completed SNF rehabilitation and therapy has cleared the patient for discharge.   REASSESSMENT OF ONGOING PROBLEM(S):  HTN: Pt 's HTN remains stable.  Denies CP, sob, DOE, pedal edema, headaches, dizziness or visual disturbances.  No complications from the medications currently being used.  Last BP : 142/73  CONSTIPATION: The constipation remains stable. No complications from the medications presently being used. Patient denies ongoing constipation, abdominal pain, nausea or vomiting.  SPINAL STENOSIS: Patient's spinal stenoses remains stable. Patient complains ongoing low back pain.  No numbness or tingling. No complications reported from the medications currently being used.  PAST MEDICAL HISTORY : Reviewed.  No changes/see problem list  CURRENT MEDICATIONS: Reviewed per MAR/see medication list  REVIEW OF SYSTEMS:  GENERAL: no change in appetite, no fatigue, no weight changes, no fever, chills or weakness RESPIRATORY: no cough, SOB, DOE, wheezing, hemoptysis CARDIAC: no chest pain, edema or palpitations GI: no abdominal pain, diarrhea, constipation, heart burn, nausea or vomiting  PHYSICAL EXAMINATION  GENERAL: no acute distress, normal body habitus NECK: supple, trachea midline, no neck masses, no thyroid tenderness, no thyromegaly LYMPHATICS: no LAN in the neck, no  supraclavicular LAN RESPIRATORY: breathing is even & unlabored, BS CTAB CARDIAC: RRR, no murmur,no extra heart sounds, no edema GI: abdomen soft, normal BS, no masses, no tenderness, no hepatomegaly, no splenomegaly EXTREMITIES: able to move all 4 extremities PSYCHIATRIC: the patient is alert & oriented to person, affect & behavior appropriate  LABS/RADIOLOGY: Labs reviewed: Basic Metabolic Panel:  Recent Labs  13/24/4000/06/01 1625 02/08/14 0650 02/09/14 0315  NA 141 139 138  K 3.4* 3.8 4.2  CL 103 102 102  CO2 23 24 25   GLUCOSE 102* 96 103*  BUN 10 21 29*  CREATININE 0.60 0.56 0.78  CALCIUM 8.8 8.8 9.4   CBC:  Recent Labs  08/27/13 1625 02/08/14 0650  WBC 9.9 10.2  NEUTROABS 7.2 7.0  HGB 12.3 13.0  HCT 37.7 38.7  MCV 87.3 84.5  PLT 296 279   CLINICAL DATA:  Low back pain, 2 weeks duration.  Unable to walk.   EXAM: MRI LUMBAR SPINE WITHOUT CONTRAST   TECHNIQUE: Multiplanar, multisequence MR imaging of the lumbar spine was performed. No intravenous contrast was administered.   COMPARISON:  Radiography 03/09/2013.  MRI 01/21/2004.   FINDINGS: There are sacral insufficiency fractures, more extensive on the left than the right. This is quite likely because of the acute syndrome.   There is curvature convex to the left with the apex at L1-2. T11-12 is normal. There is a shallow disc herniation at T12-L1 with slight caudal down turning. No neural compression. Conus tip is at this level.   L1-2: Disc degeneration more pronounced on the right with disc space narrowing, endplate osteophytes and bulging of the disc. Mild facet hypertrophy on the right. Mild stenosis of the right  lateral recess without gross neural compression.   L2-3: Retrolisthesis of 3 mm. Endplate osteophytes and bulging of the disc. Mild facet and ligamentous hypertrophy. Mild stenosis of both lateral recesses.   L3-4: Retrolisthesis of 2 mm. Circumferential bulging of the disc. Facet and  ligamentous hypertrophy. Moderate stenosis of the canal and lateral recesses.   L4-5: Advanced bilateral facet arthropathy with anterolisthesis of 1 cm. Pseudo disc herniation. Severe stenosis of the canal, lateral recesses and foramina at this level that could cause neural compression.   L5-S1: Chronic disc degeneration with endplate osteophytes and bulging of the disc. Bilateral facet hypertrophy. Stenosis of both subarticular lateral recesses without definite neural compression.   IMPRESSION: Acute bilateral sacral insufficiency fractures, worse on the left than the right, likely the cause of the acute pain syndrome.   Scoliosis convex to the left. Complex multi-level degenerative disease, generally worsened since the study of 2005.   Bilateral lateral recess stenosis at L2-3 and L3-4 that could possibly cause neural compression.   Severe multifactorial stenosis at L4-5 because of facet arthropathy with 1 cm of anterolisthesis and degenerative disc disease.   ASSESSMENT/PLAN:  Sacral fracture, closed - for Home health PT, OT and Nursing. Spinal stenosis - stable DJD - continue Naprosyn Hypertension - well controlled; continue metoprolol Constipation - continue Senokot   I have filled out patient's discharge paperwork and written prescriptions.  Patient will receive home health PT, OT and Nursing.  DME provided:  3-in-1 commode and rolling walker  Total discharge time: Greater than 30 minutes  Discharge time involved coordination of the discharge process with social worker, nursing staff and therapy department. Medical justification for home health services/DME verified.    CPT CODE: 45409  Ella Bodo - NP [facility] [phone]

## 2014-02-25 DIAGNOSIS — K219 Gastro-esophageal reflux disease without esophagitis: Secondary | ICD-10-CM | POA: Insufficient documentation

## 2014-02-25 NOTE — Progress Notes (Signed)
HISTORY & PHYSICAL  DATE: 02/21/2014   FACILITY: [facility]  LEVEL OF CARE: SNF (31)  ALLERGIES:  No Known Allergies  CHIEF COMPLAINT:  Manage sacral fracture, spinal stenosis and hypertension  HISTORY OF PRESENT ILLNESS: Patient is an 80 year old Caucasian female.  SACRAL FRACTURE: The patient had a fall and sustained a sacral fracture. Conservative management was recommended by orthopedic surgery. Patient is admitted to this facility for short-term rehabilitation. The patient denies ongoing pain. No complications reported from the pain medication(s) currently being used.  SPINAL STENOSIS: Patient's spinal stenoses remains stable. Patient denies ongoing low back pain, numbness, tingling or weakness. No complications reported from the medications currently being used.  HTN: Pt 's HTN remains stable.  Denies CP, sob, DOE, pedal edema, headaches, dizziness or visual disturbances.  No complications from the medications currently being used.  Last BP : 159/82.  PAST MEDICAL HISTORY :  Past Medical History  Diagnosis Date  . Coronary artery disease     1st diag. stenosis of 70%  . Hyperlipidemia   . Hypertension   . Aortic stenosis     mild  . Chest pain   . Osteoporosis   . Spinal stenosis   . Shingles     PAST SURGICAL HISTORY: Past Surgical History  Procedure Laterality Date  . Cardiac catheterization  10/18/2007    mild to moderate CAD,aggressive medical therapy    SOCIAL HISTORY:  reports that she has never smoked. She does not have any smokeless tobacco history on file. She reports that she does not drink alcohol or use illicit drugs.  FAMILY HISTORY: None  CURRENT MEDICATIONS: Reviewed per MAR/see medication list  REVIEW OF SYSTEMS:  See HPI otherwise 14 point ROS is negative.  PHYSICAL EXAMINATION  VS:  See VS section  GENERAL: no acute distress, normal body habitus EYES: conjunctivae normal, sclerae normal, normal eye  lids MOUTH/THROAT: lips without lesions,no lesions in the mouth,tongue is without lesions,uvula elevates in midline NECK: supple, trachea midline, no neck masses, no thyroid tenderness, no thyromegaly LYMPHATICS: no LAN in the neck, no supraclavicular LAN RESPIRATORY: breathing is even & unlabored, BS CTAB CARDIAC: RRR, no murmur,no extra heart sounds, no edema GI:  ABDOMEN: abdomen soft, normal BS, no masses, no tenderness  LIVER/SPLEEN: no hepatomegaly, no splenomegaly MUSCULOSKELETAL: HEAD: normal to inspection  EXTREMITIES: LEFT UPPER EXTREMITY: full range of motion, normal strength & tone RIGHT UPPER EXTREMITY:  full range of motion, normal strength & tone LEFT LOWER EXTREMITY:  full range of motion, normal strength & tone RIGHT LOWER EXTREMITY:  full range of motion, normal strength & tone PSYCHIATRIC: the patient is alert & oriented to person, affect & behavior appropriate  LABS/RADIOLOGY:  Labs reviewed: Basic Metabolic Panel:  Recent Labs  91/47/8200/06/01 1625 02/08/14 0650 02/09/14 0315  NA 141 139 138  K 3.4* 3.8 4.2  CL 103 102 102  CO2 23 24 25   GLUCOSE 102* 96 103*  BUN 10 21 29*  CREATININE 0.60 0.56 0.78  CALCIUM 8.8 8.8 9.4   CBC:  Recent Labs  08/27/13 1625 02/08/14 0650  WBC 9.9 10.2  NEUTROABS 7.2 7.0  HGB 12.3 13.0  HCT 37.7 38.7  MCV 87.3 84.5  PLT 296 279   MRI HEAD WITHOUT AND WITH CONTRAST   TECHNIQUE: Multiplanar, multiecho pulse sequences of the brain and surrounding structures were obtained without and with intravenous contrast.   CONTRAST:  10mL MULTIHANCE GADOBENATE DIMEGLUMINE 529 MG/ML IV  SOLN   COMPARISON:  Head CT 08/27/2013   FINDINGS: There is no evidence of acute infarct, intracranial hemorrhage, mass, midline shift, or extra-axial fluid collection. There is mild to moderate cerebral atrophy, likely within normal limits for age. Patchy and confluent T2 hyperintensities are present in the periventricular white matter and  pons as well as cerebellar peduncles, nonspecific but compatible with moderate chronic small vessel ischemic disease. There is no abnormal enhancement.   Prior bilateral cataract extraction is noted. Paranasal sinuses are clear. There is trace left mastoid fluid. Major intracranial vascular flow voids are preserved.   IMPRESSION: 1. No evidence of acute intracranial abnormality or mass. 2. Moderate chronic small vessel ischemic disease.   MRI LUMBAR SPINE WITHOUT CONTRAST   TECHNIQUE: Multiplanar, multisequence MR imaging of the lumbar spine was performed. No intravenous contrast was administered.   COMPARISON:  Radiography 03/09/2013.  MRI 01/21/2004.   FINDINGS: There are sacral insufficiency fractures, more extensive on the left than the right. This is quite likely because of the acute syndrome.   There is curvature convex to the left with the apex at L1-2. T11-12 is normal. There is a shallow disc herniation at T12-L1 with slight caudal down turning. No neural compression. Conus tip is at this level.   L1-2: Disc degeneration more pronounced on the right with disc space narrowing, endplate osteophytes and bulging of the disc. Mild facet hypertrophy on the right. Mild stenosis of the right lateral recess without gross neural compression.   L2-3: Retrolisthesis of 3 mm. Endplate osteophytes and bulging of the disc. Mild facet and ligamentous hypertrophy. Mild stenosis of both lateral recesses.   L3-4: Retrolisthesis of 2 mm. Circumferential bulging of the disc. Facet and ligamentous hypertrophy. Moderate stenosis of the canal and lateral recesses.   L4-5: Advanced bilateral facet arthropathy with anterolisthesis of 1 cm. Pseudo disc herniation. Severe stenosis of the canal, lateral recesses and foramina at this level that could cause neural compression.   L5-S1: Chronic disc degeneration with endplate osteophytes and bulging of the disc. Bilateral facet hypertrophy.  Stenosis of both subarticular lateral recesses without definite neural compression.   IMPRESSION: Acute bilateral sacral insufficiency fractures, worse on the left than the right, likely the cause of the acute pain syndrome.   Scoliosis convex to the left. Complex multi-level degenerative disease, generally worsened since the study of 2005.   Bilateral lateral recess stenosis at L2-3 and L3-4 that could possibly cause neural compression.   Severe multifactorial stenosis at L4-5 because of facet arthropathy with 1 cm of anterolisthesis and degenerative disc disease.    ASSESSMENT/PLAN:  Sacral fracture-continue rehabilitation Spinal stenosis-continue pain medications Hypertension-blood pressure elevated. We'll monitor. GERD-continue H2 blocker Osteoarthritis-denies ongoing joint pain  I have reviewed patient's medical records received at admission/from hospitalization.  CPT CODE: 56213  Angela Cox, MD [facility] [phone]

## 2014-03-03 DIAGNOSIS — I2510 Atherosclerotic heart disease of native coronary artery without angina pectoris: Secondary | ICD-10-CM

## 2014-03-03 DIAGNOSIS — IMO0002 Reserved for concepts with insufficient information to code with codable children: Secondary | ICD-10-CM

## 2014-03-03 DIAGNOSIS — M1990 Unspecified osteoarthritis, unspecified site: Secondary | ICD-10-CM

## 2014-03-03 DIAGNOSIS — M48061 Spinal stenosis, lumbar region without neurogenic claudication: Secondary | ICD-10-CM

## 2014-03-03 DIAGNOSIS — I359 Nonrheumatic aortic valve disorder, unspecified: Secondary | ICD-10-CM

## 2014-03-03 DIAGNOSIS — I10 Essential (primary) hypertension: Secondary | ICD-10-CM

## 2014-03-20 ENCOUNTER — Emergency Department (HOSPITAL_BASED_OUTPATIENT_CLINIC_OR_DEPARTMENT_OTHER)
Admission: EM | Admit: 2014-03-20 | Discharge: 2014-03-20 | Disposition: A | Discharge status: Home / Self Care | Payer: Medicare Other | Attending: Emergency Medicine | Admitting: Emergency Medicine

## 2014-03-20 ENCOUNTER — Emergency Department (HOSPITAL_BASED_OUTPATIENT_CLINIC_OR_DEPARTMENT_OTHER): Payer: Medicare Other

## 2014-03-20 ENCOUNTER — Encounter (HOSPITAL_BASED_OUTPATIENT_CLINIC_OR_DEPARTMENT_OTHER): Payer: Self-pay | Admitting: Emergency Medicine

## 2014-03-20 DIAGNOSIS — Z862 Personal history of diseases of the blood and blood-forming organs and certain disorders involving the immune mechanism: Secondary | ICD-10-CM | POA: Diagnosis not present

## 2014-03-20 DIAGNOSIS — Z8639 Personal history of other endocrine, nutritional and metabolic disease: Secondary | ICD-10-CM | POA: Insufficient documentation

## 2014-03-20 DIAGNOSIS — I10 Essential (primary) hypertension: Secondary | ICD-10-CM | POA: Diagnosis not present

## 2014-03-20 DIAGNOSIS — S79919A Unspecified injury of unspecified hip, initial encounter: Secondary | ICD-10-CM | POA: Insufficient documentation

## 2014-03-20 DIAGNOSIS — X500XXA Overexertion from strenuous movement or load, initial encounter: Secondary | ICD-10-CM | POA: Insufficient documentation

## 2014-03-20 DIAGNOSIS — Z79899 Other long term (current) drug therapy: Secondary | ICD-10-CM | POA: Diagnosis not present

## 2014-03-20 DIAGNOSIS — Y9289 Other specified places as the place of occurrence of the external cause: Secondary | ICD-10-CM | POA: Insufficient documentation

## 2014-03-20 DIAGNOSIS — I2510 Atherosclerotic heart disease of native coronary artery without angina pectoris: Secondary | ICD-10-CM | POA: Diagnosis not present

## 2014-03-20 DIAGNOSIS — S79929A Unspecified injury of unspecified thigh, initial encounter: Principal | ICD-10-CM

## 2014-03-20 DIAGNOSIS — Z8742 Personal history of other diseases of the female genital tract: Secondary | ICD-10-CM | POA: Diagnosis not present

## 2014-03-20 DIAGNOSIS — Z7982 Long term (current) use of aspirin: Secondary | ICD-10-CM | POA: Insufficient documentation

## 2014-03-20 DIAGNOSIS — Z8619 Personal history of other infectious and parasitic diseases: Secondary | ICD-10-CM | POA: Diagnosis not present

## 2014-03-20 DIAGNOSIS — Y9301 Activity, walking, marching and hiking: Secondary | ICD-10-CM | POA: Insufficient documentation

## 2014-03-20 DIAGNOSIS — Z9889 Other specified postprocedural states: Secondary | ICD-10-CM | POA: Insufficient documentation

## 2014-03-20 DIAGNOSIS — S32591D Other specified fracture of right pubis, subsequent encounter for fracture with routine healing: Secondary | ICD-10-CM

## 2014-03-20 MED ORDER — OXYCODONE-ACETAMINOPHEN 5-325 MG PO TABS: 1.0000 | ORAL_TABLET | ORAL | Status: DC | PRN

## 2014-03-20 MED ORDER — OXYCODONE-ACETAMINOPHEN 5-325 MG PO TABS
1.0000 | ORAL_TABLET | Freq: Once | ORAL | Status: AC
  Administered 2014-03-20: 1 via ORAL
  Filled 2014-03-20: qty 1

## 2014-03-20 NOTE — ED Provider Notes (Signed)
CSN: [id number]     Arrival date & time 03/20/14  1155 History   First MD Initiated Contact with Patient 03/20/14 1205     Chief Complaint  Patient presents with  . Leg Pain     (Consider location/radiation/quality/duration/timing/severity/associated sxs/prior Treatment) HPI Comments: Patient is an 80 year old female with history of hypertension, coronary artery disease. She presents today with complaints of pain in her right thigh. She states she stepped awkwardly yesterday and felt a pop. Since that time she has had an inability to bear weight with this leg. She is requiring a walker to get around.  She was recently hospitalized for 2 stress fractures in her sacrum following a fall. She spent 2 weeks in a rehabilitation facility and was just recently returned to home.  Patient is a 80 y.o. female presenting with leg pain. The history is provided by the patient.  Leg Pain Lower extremity pain location: Right thigh. Time since incident:  1 day Injury: no   Pain details:    Radiates to:  RUQ   Severity:  Moderate   Timing:  Constant   Progression:  Unchanged Chronicity:  New Dislocation: no   Prior injury to area:  No Relieved by:  Rest Worsened by:  Bearing weight   Past Medical History  Diagnosis Date  . Coronary artery disease     1st diag. stenosis of 70%  . Hyperlipidemia   . Hypertension   . Aortic stenosis     mild  . Chest pain   . Osteoporosis   . Spinal stenosis   . Shingles    Past Surgical History  Procedure Laterality Date  . Cardiac catheterization  10/18/2007    mild to moderate CAD,aggressive medical therapy   No family history on file. History  Substance Use Topics  . Smoking status: Never Smoker   . Smokeless tobacco: Not on file  . Alcohol Use: No   OB History   Grav Para Term Preterm Abortions TAB SAB Ect Mult Living                 Review of Systems  All other systems reviewed and are negative.     Allergies  Review of patient's  allergies indicates no known allergies.  Home Medications   Prior to Admission medications   Medication Sig Start Date End Date Taking? Authorizing Provider  aspirin 81 MG tablet Take 81 mg by mouth daily. For DVT, prophylaxis    Historical Provider, MD  bisacodyl (DULCOLAX) 5 MG EC tablet Take 1 tablet (5 mg total) by mouth daily as needed for moderate constipation. 02/10/14   Calvert Cantor, MD  famotidine (PEPCID) 40 MG tablet Take 40 mg by mouth daily. For GERD 02/10/14   Calvert Cantor, MD  HYDROcodone-acetaminophen (NORCO/VICODIN) 5-325 MG per tablet Take one tablet by mouth three times daily at 9am,2pm,and 9pm; Take one tablet by mouth every 4 hours as needed for moderate pain; Take two tablets by mouth every 4 hours as needed for moderate to severe pain 02/15/14   Kimber Relic, MD  metoprolol tartrate (LOPRESSOR) 25 MG tablet Take 25 mg by mouth daily. For HTN    Historical Provider, MD  naproxen (NAPROSYN) 375 MG tablet Take 1 tablet (375 mg total) by mouth 2 (two) times daily with a meal. 02/10/14   Calvert Cantor, MD  oxyCODONE-acetaminophen (PERCOCET/ROXICET) 5-325 MG per tablet Take 2 tablets by mouth every 4 (four) hours as needed. Hold for sedation    Historical Provider,  MD  senna (SENOKOT) 8.6 MG TABS tablet Take 1 tablet by mouth daily. For constipation 02/10/14   Calvert CantorSaima Rizwan, MD   BP 152/61  Pulse 105  Temp(Src) 97.8 F (36.6 C) (Oral)  Resp 18  Ht 5' (1.524 m)  Wt 110 lb (49.896 kg)  BMI 21.48 kg/m2  SpO2 100% Physical Exam  Nursing note and vitals reviewed. Constitutional: She is oriented to person, place, and time. She appears well-developed and well-nourished. No distress.  HENT:  Head: Normocephalic and atraumatic.  Neck: Normal range of motion. Neck supple.  Musculoskeletal: Normal range of motion. She exhibits no edema.  The right lower extremity appears grossly normal. There is no shortening or external rotation. DP and PT pulses are easily palpable. There is some  pain with external rotation and palpation over the mid thigh anteriorly. Sensation and motor are intact to the right foot.  Neurological: She is alert and oriented to person, place, and time.  Skin: Skin is warm and dry. She is not diaphoretic.    ED Course  Procedures (including critical care time) Labs Review Labs Reviewed - No data to display  Imaging Review No results found.   EKG Interpretation None      MDM   Final diagnoses:  None    Patient presents here with complaints of pain in the right hip and thigh. She was recently hospitalized for stress fractures in her sacrum and spent several weeks in rehabilitation for this. She has been home for 2 weeks and doing well until yesterday. She was walking and felt a pop in her right hip. She has been having difficulty in regulating since this time.  X-rays today reveal a superior/inferior rami fracture of her right pelvis. Upon reviewing her old record, I have found no mention of this injury, only the stress fracture in the sacrum. This injury appears to be in the process of healing and I suspect she sustained this as well when she fell in the end of June. She was given Percocet and was ambulated without difficulty. The patient is adamant about not being admitted to the hospital -- she was able to ambulate, I feel as though this is appropriate. She will be discharged to home with a prescription for Percocet and followup with her doctor this week as scheduled.    Geoffery Lyonsouglas Tiki Tucciarone, MD 03/20/14 [phone]

## 2014-03-20 NOTE — ED Notes (Signed)
Patient states she was walking yesterday around 1500 and felt a pop in her right thigh.  Now is unable to bear weight.  Recently has been treated for stress fractures of her sacrum.

## 2014-03-20 NOTE — Discharge Instructions (Signed)
Percocet as prescribed as needed for pain.  Weightbearing as tolerated.  Followup with your primary Dr. in one week and return to the ER if your symptoms substantially worsen or change.

## 2014-03-22 ENCOUNTER — Other Ambulatory Visit: Payer: Self-pay | Admitting: Neurological Surgery

## 2014-03-22 ENCOUNTER — Encounter (HOSPITAL_COMMUNITY): Payer: Self-pay

## 2014-03-25 NOTE — Pre-Procedure Instructions (Signed)
[name] Fields  03/25/2014   Your procedure is scheduled on:  August 14  [facility] Admitting at 1:15 PM.  Call this number if you have problems the morning of surgery: [phone]   Remember:   Do not eat food or drink liquids after midnight.   Take these medicines the morning of surgery with A SIP OF WATER: Hydrocodone (if needed), Metoprolol, Oxycodone (if needed)   STOP Aspirin today   STOP/ Do not take Aspirin, Aleve, Naproxen, Advil, Ibuprofen, Motrin, Vitamins, Herbs, or Supplements starting today   Do not wear jewelry, make-up or nail polish.  Do not wear lotions, powders, or perfumes. You may wear deodorant.  Do not shave 48 hours prior to surgery. Men may shave face and neck.  Do not bring valuables to the hospital.  [facility] is not [facility] for any belongings or valuables.               Contacts, dentures or bridgework may not be worn into surgery.  Leave suitcase in the car. After surgery it may be brought to your room.  For patients admitted to the hospital, discharge time is determined by your treatment team.               Special Instructions: See [facility] Preparing For [facility]   Please read over the following fact sheets that you were given: Pain Booklet, Coughing and Deep Breathing, Blood Transfusion Information and Surgical Site Infection Prevention

## 2014-03-27 ENCOUNTER — Encounter (HOSPITAL_COMMUNITY)
Admission: RE | Admit: 2014-03-27 | Discharge: 2014-03-27 | Disposition: A | Discharge status: Home / Self Care | Payer: Medicare Other | Source: Ambulatory Visit | Attending: Neurological Surgery | Admitting: Neurological Surgery

## 2014-03-27 ENCOUNTER — Ambulatory Visit (HOSPITAL_COMMUNITY)
Admission: RE | Admit: 2014-03-27 | Discharge: 2014-03-27 | Disposition: A | Discharge status: Home / Self Care | Payer: Medicare Other | Source: Ambulatory Visit | Attending: Anesthesiology | Admitting: Anesthesiology

## 2014-03-27 ENCOUNTER — Encounter (HOSPITAL_COMMUNITY): Payer: Self-pay

## 2014-03-27 DIAGNOSIS — Z01818 Encounter for other preprocedural examination: Secondary | ICD-10-CM | POA: Diagnosis present

## 2014-03-27 HISTORY — DX: Unspecified osteoarthritis, unspecified site: M19.90

## 2014-03-27 HISTORY — DX: Personal history of (healed) stress fracture: Z87.312

## 2014-03-27 LAB — COMPREHENSIVE METABOLIC PANEL
ALBUMIN: 3.6 g/dL (ref 3.5–5.2)
ALT: 10 U/L (ref 0–35)
ANION GAP: 13 (ref 5–15)
AST: 18 U/L (ref 0–37)
Alkaline Phosphatase: 134 U/L — ABNORMAL HIGH (ref 39–117)
BUN: 24 mg/dL — AB (ref 6–23)
CO2: 25 mEq/L (ref 19–32)
CREATININE: 0.67 mg/dL (ref 0.50–1.10)
Calcium: 9.2 mg/dL (ref 8.4–10.5)
Chloride: 103 mEq/L (ref 96–112)
GFR calc Af Amer: 89 mL/min — ABNORMAL LOW (ref >90)
GFR calc non Af Amer: 77 mL/min — ABNORMAL LOW (ref >90)
Glucose, Bld: 99 mg/dL (ref 70–99)
POTASSIUM: 4.7 meq/L (ref 3.7–5.3)
Sodium: 141 mEq/L (ref 137–147)
TOTAL PROTEIN: 7.1 g/dL (ref 6.0–8.3)
Total Bilirubin: 0.5 mg/dL (ref 0.3–1.2)

## 2014-03-27 LAB — ABO/RH: ABO/RH(D): A POS

## 2014-03-27 LAB — CBC
HCT: 42.4 % (ref 36.0–46.0)
Hemoglobin: 13.8 g/dL (ref 12.0–15.0)
MCH: 28.8 pg (ref 26.0–34.0)
MCHC: 32.5 g/dL (ref 30.0–36.0)
MCV: 88.3 fL (ref 78.0–100.0)
PLATELETS: 319 10*3/uL (ref 150–400)
RBC: 4.8 MIL/uL (ref 3.87–5.11)
RDW: 14.8 % (ref 11.5–15.5)
WBC: 9.7 10*3/uL (ref 4.0–10.5)

## 2014-03-27 LAB — TYPE AND SCREEN
ABO/RH(D): A POS
Antibody Screen: NEGATIVE

## 2014-03-27 LAB — SURGICAL PCR SCREEN
MRSA, PCR: NEGATIVE
STAPHYLOCOCCUS AUREUS: NEGATIVE

## 2014-03-27 NOTE — Progress Notes (Addendum)
Spoke with Erie NoeVanessa about getting orders.  DA Spoke with scheduling nurse concerning possible po intake the morning of surgery, since she is not due to come into Short Stay until 1:30.   She was instructed on clear liquids up until 6-6:30 AM  ...da Pt did see Dr. Elease HashimotoNahser back in 2008-2009.  He has since 'cut her loose' and she has had no other cardiac issues Had heart cath in 2009.  Sees Dr. Romeo AppleHal Stoneking--have requested EKG which was done in June 2015.  DA

## 2014-03-31 NOTE — Progress Notes (Signed)
Pt updated with new arrival time of 5:30 AM on Monday, April 03, 2014; pt advised to follow instructions given at PAT visit on 03/27/14. Pt verbalized understanding of all instructions given.

## 2014-04-03 ENCOUNTER — Inpatient Hospital Stay (HOSPITAL_COMMUNITY)
Admission: RE | Admit: 2014-04-03 | Discharge: 2014-04-06 | DRG: 460 | Disposition: A | Discharge status: Skilled Nursing Facility | Payer: Medicare Other | Source: Ambulatory Visit | Attending: Neurological Surgery | Admitting: Neurological Surgery

## 2014-04-03 ENCOUNTER — Encounter (HOSPITAL_COMMUNITY): Payer: Self-pay | Admitting: *Deleted

## 2014-04-03 ENCOUNTER — Encounter (HOSPITAL_COMMUNITY)
Admission: RE | Disposition: A | Discharge status: Skilled Nursing Facility | Payer: Medicare Other | Source: Ambulatory Visit | Attending: Neurological Surgery

## 2014-04-03 ENCOUNTER — Inpatient Hospital Stay (HOSPITAL_COMMUNITY): Payer: Medicare Other | Admitting: Certified Registered Nurse Anesthetist

## 2014-04-03 ENCOUNTER — Inpatient Hospital Stay (HOSPITAL_COMMUNITY): Payer: Medicare Other

## 2014-04-03 ENCOUNTER — Encounter (HOSPITAL_COMMUNITY): Payer: Medicare Other | Admitting: Certified Registered Nurse Anesthetist

## 2014-04-03 DIAGNOSIS — I10 Essential (primary) hypertension: Secondary | ICD-10-CM | POA: Diagnosis present

## 2014-04-03 DIAGNOSIS — M4310 Spondylolisthesis, site unspecified: Secondary | ICD-10-CM | POA: Diagnosis present

## 2014-04-03 DIAGNOSIS — I359 Nonrheumatic aortic valve disorder, unspecified: Secondary | ICD-10-CM | POA: Diagnosis present

## 2014-04-03 DIAGNOSIS — M129 Arthropathy, unspecified: Secondary | ICD-10-CM | POA: Diagnosis present

## 2014-04-03 DIAGNOSIS — M549 Dorsalgia, unspecified: Secondary | ICD-10-CM | POA: Diagnosis present

## 2014-04-03 DIAGNOSIS — M48061 Spinal stenosis, lumbar region without neurogenic claudication: Principal | ICD-10-CM | POA: Diagnosis present

## 2014-04-03 DIAGNOSIS — Z7982 Long term (current) use of aspirin: Secondary | ICD-10-CM | POA: Diagnosis not present

## 2014-04-03 DIAGNOSIS — M810 Age-related osteoporosis without current pathological fracture: Secondary | ICD-10-CM | POA: Diagnosis present

## 2014-04-03 DIAGNOSIS — M4316 Spondylolisthesis, lumbar region: Secondary | ICD-10-CM

## 2014-04-03 DIAGNOSIS — E785 Hyperlipidemia, unspecified: Secondary | ICD-10-CM | POA: Diagnosis present

## 2014-04-03 DIAGNOSIS — I2510 Atherosclerotic heart disease of native coronary artery without angina pectoris: Secondary | ICD-10-CM | POA: Diagnosis present

## 2014-04-03 DIAGNOSIS — N390 Urinary tract infection, site not specified: Secondary | ICD-10-CM | POA: Diagnosis not present

## 2014-04-03 SURGERY — POSTERIOR LUMBAR FUSION 1 LEVEL
Anesthesia: General | Site: Back

## 2014-04-03 MED ORDER — SENNA 8.6 MG PO TABS
1.0000 | ORAL_TABLET | Freq: Two times a day (BID) | ORAL | Status: DC
  Administered 2014-04-03 – 2014-04-06 (×6): 8.6 mg via ORAL
  Filled 2014-04-03 (×7): qty 1

## 2014-04-03 MED ORDER — LIDOCAINE-EPINEPHRINE 1 %-1:100000 IJ SOLN
INTRAMUSCULAR | Status: DC | PRN
  Administered 2014-04-03: 5 mL

## 2014-04-03 MED ORDER — PHENYLEPHRINE HCL 10 MG/ML IJ SOLN
INTRAMUSCULAR | Status: DC | PRN
Start: 2014-04-03 — End: 2014-04-03
  Administered 2014-04-03: 40 ug via INTRAVENOUS
  Administered 2014-04-03: 80 ug via INTRAVENOUS
  Administered 2014-04-03 (×2): 40 ug via INTRAVENOUS
  Administered 2014-04-03: 80 ug via INTRAVENOUS

## 2014-04-03 MED ORDER — ACETAMINOPHEN 650 MG RE SUPP: 650.0000 mg | RECTAL | Status: DC | PRN

## 2014-04-03 MED ORDER — ONDANSETRON HCL 4 MG/2ML IJ SOLN
4.0000 mg | INTRAMUSCULAR | Status: DC | PRN
  Administered 2014-04-03: 4 mg via INTRAVENOUS
  Filled 2014-04-03: qty 2

## 2014-04-03 MED ORDER — ARTIFICIAL TEARS OP OINT
TOPICAL_OINTMENT | OPHTHALMIC | Status: AC
  Filled 2014-04-03: qty 3.5

## 2014-04-03 MED ORDER — FLEET ENEMA 7-19 GM/118ML RE ENEM
1.0000 | ENEMA | Freq: Once | RECTAL | Status: AC | PRN
Start: 2014-04-03 — End: 2014-04-03

## 2014-04-03 MED ORDER — HYDROMORPHONE HCL PF 1 MG/ML IJ SOLN
0.5000 mg | INTRAMUSCULAR | Status: DC | PRN
  Administered 2014-04-04: 0.5 mg via INTRAVENOUS
  Filled 2014-04-03: qty 1

## 2014-04-03 MED ORDER — CEFAZOLIN SODIUM-DEXTROSE 2-3 GM-% IV SOLR
INTRAVENOUS | Status: AC
  Filled 2014-04-03: qty 50

## 2014-04-03 MED ORDER — LACTATED RINGERS IV SOLN
INTRAVENOUS | Status: DC | PRN
  Administered 2014-04-03 (×2): via INTRAVENOUS

## 2014-04-03 MED ORDER — METHOCARBAMOL 1000 MG/10ML IJ SOLN
500.0000 mg | Freq: Four times a day (QID) | INTRAVENOUS | Status: DC | PRN
  Filled 2014-04-03: qty 5

## 2014-04-03 MED ORDER — PROPOFOL 10 MG/ML IV BOLUS
INTRAVENOUS | Status: AC
  Filled 2014-04-03: qty 20

## 2014-04-03 MED ORDER — PROPOFOL 10 MG/ML IV BOLUS
INTRAVENOUS | Status: DC | PRN
  Administered 2014-04-03: 80 mg via INTRAVENOUS

## 2014-04-03 MED ORDER — DOCUSATE SODIUM 100 MG PO CAPS
100.0000 mg | ORAL_CAPSULE | Freq: Two times a day (BID) | ORAL | Status: DC
  Administered 2014-04-03 – 2014-04-06 (×6): 100 mg via ORAL
  Filled 2014-04-03 (×6): qty 1

## 2014-04-03 MED ORDER — METHOCARBAMOL 500 MG PO TABS
ORAL_TABLET | ORAL | Status: AC
  Administered 2014-04-03: 500 mg via ORAL
  Filled 2014-04-03: qty 1

## 2014-04-03 MED ORDER — ONDANSETRON HCL 4 MG/2ML IJ SOLN
INTRAMUSCULAR | Status: AC
  Filled 2014-04-03: qty 2

## 2014-04-03 MED ORDER — ROCURONIUM BROMIDE 50 MG/5ML IV SOLN
INTRAVENOUS | Status: AC
  Filled 2014-04-03: qty 1

## 2014-04-03 MED ORDER — PHENYLEPHRINE 40 MCG/ML (10ML) SYRINGE FOR IV PUSH (FOR BLOOD PRESSURE SUPPORT)
PREFILLED_SYRINGE | INTRAVENOUS | Status: AC
  Filled 2014-04-03: qty 10

## 2014-04-03 MED ORDER — MENTHOL 3 MG MT LOZG: 1.0000 | LOZENGE | OROMUCOSAL | Status: DC | PRN

## 2014-04-03 MED ORDER — FENTANYL CITRATE 0.05 MG/ML IJ SOLN
INTRAMUSCULAR | Status: DC | PRN
  Administered 2014-04-03: 25 ug via INTRAVENOUS
  Administered 2014-04-03: 50 ug via INTRAVENOUS
  Administered 2014-04-03: 150 ug via INTRAVENOUS
  Administered 2014-04-03: 25 ug via INTRAVENOUS

## 2014-04-03 MED ORDER — CEFAZOLIN SODIUM-DEXTROSE 2-3 GM-% IV SOLR
2.0000 g | Freq: Once | INTRAVENOUS | Status: AC
  Administered 2014-04-03: 2 g via INTRAVENOUS

## 2014-04-03 MED ORDER — SURGIFOAM 100 EX MISC
CUTANEOUS | Status: DC | PRN
  Administered 2014-04-03: 09:00:00 via TOPICAL

## 2014-04-03 MED ORDER — DEXAMETHASONE SODIUM PHOSPHATE 4 MG/ML IJ SOLN
INTRAMUSCULAR | Status: AC
Start: 2014-04-03 — End: 2014-04-03
  Filled 2014-04-03: qty 2

## 2014-04-03 MED ORDER — ONDANSETRON HCL 4 MG/2ML IJ SOLN
INTRAMUSCULAR | Status: DC | PRN
  Administered 2014-04-03: 4 mg via INTRAVENOUS

## 2014-04-03 MED ORDER — SODIUM CHLORIDE 0.9 % IR SOLN
Status: DC | PRN
  Administered 2014-04-03: 09:00:00

## 2014-04-03 MED ORDER — POLYETHYLENE GLYCOL 3350 17 G PO PACK
17.0000 g | PACK | Freq: Every day | ORAL | Status: DC | PRN
  Administered 2014-04-05: 17 g via ORAL
  Filled 2014-04-03: qty 1

## 2014-04-03 MED ORDER — BUPIVACAINE HCL (PF) 0.5 % IJ SOLN
INTRAMUSCULAR | Status: DC | PRN
  Administered 2014-04-03: 20 mL
  Administered 2014-04-03: 5 mL

## 2014-04-03 MED ORDER — SODIUM CHLORIDE 0.9 % IV SOLN: 250.0000 mL | INTRAVENOUS | Status: DC

## 2014-04-03 MED ORDER — SODIUM CHLORIDE 0.9 % IJ SOLN
3.0000 mL | Freq: Two times a day (BID) | INTRAMUSCULAR | Status: DC
  Administered 2014-04-04 – 2014-04-05 (×2): 3 mL via INTRAVENOUS

## 2014-04-03 MED ORDER — NEOSTIGMINE METHYLSULFATE 10 MG/10ML IV SOLN
INTRAVENOUS | Status: AC
  Filled 2014-04-03: qty 1

## 2014-04-03 MED ORDER — FENTANYL CITRATE 0.05 MG/ML IJ SOLN
25.0000 ug | INTRAMUSCULAR | Status: DC | PRN
  Administered 2014-04-03: 50 ug via INTRAVENOUS
  Administered 2014-04-03: 25 ug via INTRAVENOUS

## 2014-04-03 MED ORDER — ALUM & MAG HYDROXIDE-SIMETH 200-200-20 MG/5ML PO SUSP
30.0000 mL | Freq: Four times a day (QID) | ORAL | Status: DC | PRN
  Filled 2014-04-03: qty 30

## 2014-04-03 MED ORDER — 0.9 % SODIUM CHLORIDE (POUR BTL) OPTIME
TOPICAL | Status: DC | PRN
  Administered 2014-04-03: 1000 mL

## 2014-04-03 MED ORDER — FENTANYL CITRATE 0.05 MG/ML IJ SOLN
INTRAMUSCULAR | Status: AC
  Administered 2014-04-03: 50 ug via INTRAVENOUS
  Filled 2014-04-03: qty 2

## 2014-04-03 MED ORDER — SODIUM CHLORIDE 0.9 % IJ SOLN: 3.0000 mL | INTRAMUSCULAR | Status: DC | PRN

## 2014-04-03 MED ORDER — EPHEDRINE SULFATE 50 MG/ML IJ SOLN
INTRAMUSCULAR | Status: AC
  Filled 2014-04-03: qty 1

## 2014-04-03 MED ORDER — SODIUM CHLORIDE 0.9 % IV SOLN
INTRAVENOUS | Status: DC
  Administered 2014-04-03 – 2014-04-04 (×2): via INTRAVENOUS

## 2014-04-03 MED ORDER — ACETAMINOPHEN 325 MG PO TABS
650.0000 mg | ORAL_TABLET | ORAL | Status: DC | PRN
  Administered 2014-04-04: 650 mg via ORAL

## 2014-04-03 MED ORDER — DROPERIDOL 2.5 MG/ML IJ SOLN
0.6250 mg | INTRAMUSCULAR | Status: DC | PRN
  Filled 2014-04-03: qty 0.25

## 2014-04-03 MED ORDER — FENTANYL CITRATE 0.05 MG/ML IJ SOLN
INTRAMUSCULAR | Status: AC
  Filled 2014-04-03: qty 5

## 2014-04-03 MED ORDER — NEOSTIGMINE METHYLSULFATE 10 MG/10ML IV SOLN
INTRAVENOUS | Status: DC | PRN
  Administered 2014-04-03: 3 mg via INTRAVENOUS

## 2014-04-03 MED ORDER — LIDOCAINE HCL (CARDIAC) 20 MG/ML IV SOLN
INTRAVENOUS | Status: AC
  Filled 2014-04-03: qty 5

## 2014-04-03 MED ORDER — BISACODYL 10 MG RE SUPP
10.0000 mg | Freq: Every day | RECTAL | Status: DC | PRN
Start: 2014-04-03 — End: 2014-04-06

## 2014-04-03 MED ORDER — OXYCODONE-ACETAMINOPHEN 5-325 MG PO TABS
ORAL_TABLET | ORAL | Status: AC
  Administered 2014-04-03: 2 via ORAL
  Filled 2014-04-03: qty 2

## 2014-04-03 MED ORDER — ROCURONIUM BROMIDE 100 MG/10ML IV SOLN
INTRAVENOUS | Status: DC | PRN
  Administered 2014-04-03: 35 mg via INTRAVENOUS

## 2014-04-03 MED ORDER — STERILE WATER FOR INJECTION IJ SOLN
INTRAMUSCULAR | Status: AC
  Filled 2014-04-03: qty 10

## 2014-04-03 MED ORDER — LIDOCAINE HCL (CARDIAC) 20 MG/ML IV SOLN
INTRAVENOUS | Status: DC | PRN
  Administered 2014-04-03: 80 mg via INTRAVENOUS

## 2014-04-03 MED ORDER — CEFAZOLIN SODIUM 1-5 GM-% IV SOLN
1.0000 g | Freq: Three times a day (TID) | INTRAVENOUS | Status: AC
  Administered 2014-04-03: 1 g via INTRAVENOUS
  Filled 2014-04-03 (×2): qty 50

## 2014-04-03 MED ORDER — METOPROLOL TARTRATE 25 MG PO TABS
25.0000 mg | ORAL_TABLET | Freq: Every day | ORAL | Status: DC
  Administered 2014-04-04 – 2014-04-06 (×3): 25 mg via ORAL
  Filled 2014-04-03 (×3): qty 1

## 2014-04-03 MED ORDER — CEFAZOLIN SODIUM 1-5 GM-% IV SOLN
1.0000 g | Freq: Three times a day (TID) | INTRAVENOUS | Status: DC
  Filled 2014-04-03 (×3): qty 50

## 2014-04-03 MED ORDER — GLYCOPYRROLATE 0.2 MG/ML IJ SOLN
INTRAMUSCULAR | Status: AC
  Filled 2014-04-03: qty 1

## 2014-04-03 MED ORDER — EPHEDRINE SULFATE 50 MG/ML IJ SOLN
INTRAMUSCULAR | Status: DC | PRN
  Administered 2014-04-03 (×2): 5 mg via INTRAVENOUS

## 2014-04-03 MED ORDER — ARTIFICIAL TEARS OP OINT
TOPICAL_OINTMENT | OPHTHALMIC | Status: DC | PRN
  Administered 2014-04-03: 1 via OPHTHALMIC

## 2014-04-03 MED ORDER — SUCCINYLCHOLINE CHLORIDE 20 MG/ML IJ SOLN
INTRAMUSCULAR | Status: AC
  Filled 2014-04-03: qty 1

## 2014-04-03 MED ORDER — METHOCARBAMOL 500 MG PO TABS
500.0000 mg | ORAL_TABLET | Freq: Four times a day (QID) | ORAL | Status: DC | PRN
  Administered 2014-04-03 – 2014-04-05 (×6): 500 mg via ORAL
  Filled 2014-04-03 (×6): qty 1

## 2014-04-03 MED ORDER — PHENOL 1.4 % MT LIQD: 1.0000 | OROMUCOSAL | Status: DC | PRN

## 2014-04-03 MED ORDER — GLYCOPYRROLATE 0.2 MG/ML IJ SOLN
INTRAMUSCULAR | Status: DC | PRN
  Administered 2014-04-03: 0.4 mg via INTRAVENOUS

## 2014-04-03 MED ORDER — OXYCODONE-ACETAMINOPHEN 5-325 MG PO TABS
1.0000 | ORAL_TABLET | ORAL | Status: DC | PRN
  Administered 2014-04-03: 1 via ORAL
  Administered 2014-04-03: 2 via ORAL
  Administered 2014-04-04 (×3): 1 via ORAL
  Administered 2014-04-05: 2 via ORAL
  Administered 2014-04-05 – 2014-04-06 (×3): 1 via ORAL
  Administered 2014-04-06 (×2): 2 via ORAL
  Filled 2014-04-03 (×3): qty 1
  Filled 2014-04-03 (×2): qty 2
  Filled 2014-04-03: qty 1
  Filled 2014-04-03: qty 2
  Filled 2014-04-03: qty 1
  Filled 2014-04-03: qty 2
  Filled 2014-04-03 (×3): qty 1

## 2014-04-03 MED ORDER — DEXAMETHASONE SODIUM PHOSPHATE 4 MG/ML IJ SOLN
INTRAMUSCULAR | Status: DC | PRN
  Administered 2014-04-03: 8 mg via INTRAVENOUS

## 2014-04-03 MED FILL — Heparin Sodium (Porcine) Inj 1000 Unit/ML: INTRAMUSCULAR | Qty: 30 | Status: AC

## 2014-04-03 MED FILL — Sodium Chloride IV Soln 0.9%: INTRAVENOUS | Qty: 1000 | Status: AC

## 2014-04-03 SURGICAL SUPPLY — 70 items
ADH SKN CLS APL DERMABOND .7 (GAUZE/BANDAGES/DRESSINGS) ×1
ADH SKN CLS LQ APL DERMABOND (GAUZE/BANDAGES/DRESSINGS) ×1
BAG DECANTER FOR FLEXI CONT (MISCELLANEOUS) ×3 IMPLANT
BLADE SURG ROTATE 9660 (MISCELLANEOUS) IMPLANT
BONE MATRIX OSTEOCEL PRO MED (Bone Implant) ×4 IMPLANT
BUR MATCHSTICK NEURO 3.0 LAGG (BURR) ×3 IMPLANT
CAGE COROENT MP 8X9X23M-8 SPIN (Cage) ×2 IMPLANT
CANISTER SUCT 3000ML (MISCELLANEOUS) ×3 IMPLANT
CONT SPEC 4OZ CLIKSEAL STRL BL (MISCELLANEOUS) ×6 IMPLANT
COVER BACK TABLE 24X17X13 BIG (DRAPES) IMPLANT
COVER TABLE BACK 60X90 (DRAPES) ×3 IMPLANT
DECANTER SPIKE VIAL GLASS SM (MISCELLANEOUS) ×3 IMPLANT
DERMABOND ADHESIVE PROPEN (GAUZE/BANDAGES/DRESSINGS) ×2
DERMABOND ADVANCED (GAUZE/BANDAGES/DRESSINGS) ×2
DERMABOND ADVANCED .7 DNX12 (GAUZE/BANDAGES/DRESSINGS) ×1 IMPLANT
DERMABOND ADVANCED .7 DNX6 (GAUZE/BANDAGES/DRESSINGS) ×1 IMPLANT
DRAPE C-ARM 42X72 X-RAY (DRAPES) ×6 IMPLANT
DRAPE LAPAROTOMY 100X72X124 (DRAPES) ×3 IMPLANT
DRAPE POUCH INSTRU U-SHP 10X18 (DRAPES) ×3 IMPLANT
DRAPE PROXIMA HALF (DRAPES) IMPLANT
DURAPREP 26ML APPLICATOR (WOUND CARE) ×3 IMPLANT
ELECT REM PT RETURN 9FT ADLT (ELECTROSURGICAL) ×3
ELECTRODE REM PT RTRN 9FT ADLT (ELECTROSURGICAL) ×1 IMPLANT
GAUZE SPONGE 4X4 12PLY STRL (GAUZE/BANDAGES/DRESSINGS) ×3 IMPLANT
GAUZE SPONGE 4X4 16PLY XRAY LF (GAUZE/BANDAGES/DRESSINGS) IMPLANT
GLOVE BIO SURGEON STRL SZ8 (GLOVE) ×2 IMPLANT
GLOVE BIOGEL PI IND STRL 7.5 (GLOVE) IMPLANT
GLOVE BIOGEL PI IND STRL 8.5 (GLOVE) ×2 IMPLANT
GLOVE BIOGEL PI INDICATOR 7.5 (GLOVE) ×2
GLOVE BIOGEL PI INDICATOR 8.5 (GLOVE) ×4
GLOVE ECLIPSE 8.5 STRL (GLOVE) ×6 IMPLANT
GLOVE EXAM NITRILE LRG STRL (GLOVE) IMPLANT
GLOVE EXAM NITRILE MD LF STRL (GLOVE) IMPLANT
GLOVE EXAM NITRILE XL STR (GLOVE) IMPLANT
GLOVE EXAM NITRILE XS STR PU (GLOVE) IMPLANT
GLOVE OPTIFIT SS 8.5 STRL (GLOVE) ×2 IMPLANT
GLOVE SS BIOGEL STRL SZ 7 (GLOVE) IMPLANT
GLOVE SUPERSENSE BIOGEL SZ 7 (GLOVE) ×6
GOWN STRL REUS W/ TWL LRG LVL3 (GOWN DISPOSABLE) IMPLANT
GOWN STRL REUS W/ TWL XL LVL3 (GOWN DISPOSABLE) IMPLANT
GOWN STRL REUS W/TWL 2XL LVL3 (GOWN DISPOSABLE) ×6 IMPLANT
GOWN STRL REUS W/TWL LRG LVL3 (GOWN DISPOSABLE) ×6
GOWN STRL REUS W/TWL XL LVL3 (GOWN DISPOSABLE) ×3
HEMOSTAT POWDER KIT SURGIFOAM (HEMOSTASIS) IMPLANT
KIT BASIN OR (CUSTOM PROCEDURE TRAY) ×3 IMPLANT
KIT ROOM TURNOVER OR (KITS) ×3 IMPLANT
MILL MEDIUM DISP (BLADE) ×2 IMPLANT
NEEDLE HYPO 22GX1.5 SAFETY (NEEDLE) ×3 IMPLANT
NS IRRIG 1000ML POUR BTL (IV SOLUTION) ×3 IMPLANT
PACK LAMINECTOMY NEURO (CUSTOM PROCEDURE TRAY) ×3 IMPLANT
PAD ARMBOARD 7.5X6 YLW CONV (MISCELLANEOUS) ×9 IMPLANT
PATTIES SURGICAL .5 X1 (DISPOSABLE) ×3 IMPLANT
ROD PRE-BENT 35MM (Rod) ×4 IMPLANT
SCREW 6.5X40MM (Screw) ×12 IMPLANT
SCREW BN 40X6.5XPA NS SPNE (Screw) IMPLANT
SCREW LOCK (Screw) ×12 IMPLANT
SCREW LOCK 100X5.5X OPN (Screw) ×4 IMPLANT
SPONGE LAP 4X18 X RAY DECT (DISPOSABLE) IMPLANT
SPONGE SURGIFOAM ABS GEL 100 (HEMOSTASIS) ×3 IMPLANT
SUT VIC AB 1 CT1 18XBRD ANBCTR (SUTURE) ×1 IMPLANT
SUT VIC AB 1 CT1 8-18 (SUTURE) ×3
SUT VIC AB 2-0 CP2 18 (SUTURE) ×3 IMPLANT
SUT VIC AB 3-0 SH 8-18 (SUTURE) ×3 IMPLANT
SYR 20ML ECCENTRIC (SYRINGE) ×3 IMPLANT
SYR 3ML LL SCALE MARK (SYRINGE) ×12 IMPLANT
TOWEL OR 17X24 6PK STRL BLUE (TOWEL DISPOSABLE) ×3 IMPLANT
TOWEL OR 17X26 10 PK STRL BLUE (TOWEL DISPOSABLE) ×3 IMPLANT
TRAP SPECIMEN MUCOUS 40CC (MISCELLANEOUS) ×3 IMPLANT
TRAY FOLEY CATH 14FRSI W/METER (CATHETERS) ×3 IMPLANT
WATER STERILE IRR 1000ML POUR (IV SOLUTION) ×3 IMPLANT

## 2014-04-03 NOTE — H&P (Signed)
[name] Fields is an 80 y.o. female.   Chief Complaint: Back pain and leg weakness can't walk HPI: Patient is an 80 year old individual who has a degenerative spondylolisthesis at L4-L5. I've been treating this condition conservatively however in the past month and a half the patient's condition is decompensated to the point that she cannot walk. She is advised regarding surgical decompression and stabilization at L4-L5 and is now admitted for this procedure.  Past Medical History  Diagnosis Date  . Coronary artery disease     1st diag. stenosis of 70%  . Hyperlipidemia   . Hypertension   . Aortic stenosis     mild  . Chest pain   . Osteoporosis   . Spinal stenosis   . Shingles   . Arthritis   . Hx stress fracture     Lumbar 4-5    Past Surgical History  Procedure Laterality Date  . Cardiac catheterization  10/18/2007    mild to moderate CAD,aggressive medical therapy  . Abdominal surgery      bowel obstruction  . Eye surgery      cataracts bil    History reviewed. No pertinent family history. Social History:  reports that she has never smoked. She does not have any smokeless tobacco history on file. She reports that she does not drink alcohol or use illicit drugs.  Allergies:  Allergies  Allergen Reactions  . Other Hives    Paper Tape    Medications Prior to Admission  Medication Sig Dispense Refill  . aspirin EC 81 MG tablet Take 81 mg by mouth daily.      Marland Kitchen. HYDROcodone-acetaminophen (NORCO/VICODIN) 5-325 MG per tablet Take 1-2 tablets by mouth every 6 (six) hours as needed for moderate pain.      . metoprolol tartrate (LOPRESSOR) 25 MG tablet Take 25 mg by mouth daily. For HTN      . oxyCODONE-acetaminophen (PERCOCET/ROXICET) 5-325 MG per tablet Take 1-2 tablets by mouth every 4 (four) hours as needed for severe pain. Hold for sedation        No results found for this or any previous visit (from the past 48 hour(s)). No results found.  Review of Systems   HENT: Negative.   Eyes: Negative.   Respiratory: Negative.   Cardiovascular: Negative.   Gastrointestinal: Negative.   Musculoskeletal: Positive for back pain.  Skin: Negative.   Neurological: Positive for focal weakness and weakness.  Endo/Heme/Allergies: Negative.   Psychiatric/Behavioral: Negative.     Blood pressure 176/59, pulse 69, temperature 97.2 F (36.2 C), temperature source Oral, resp. rate 18, weight 46.579 kg (102 lb 11 oz), SpO2 100.00%. Physical Exam  Constitutional: She is oriented to person, place, and time. She appears well-developed and well-nourished.  HENT:  Head: Normocephalic and atraumatic.  Eyes: Conjunctivae and EOM are normal. Pupils are equal, round, and reactive to light.  Neck: Normal range of motion. Neck supple.  Cardiovascular: Normal rate and regular rhythm.   Respiratory: Effort normal and breath sounds normal.  GI: Soft. Bowel sounds are normal.  Musculoskeletal:  Centralized low back pain.   Neurological: She is alert and oriented to person, place, and time.  Motor function is intact in both lower extremities with weakness in the tibialis anterior bilaterally. Patient cannot bear weight to walk because of the increase in pain with standing.  Skin: Skin is warm and dry.  Psychiatric: She has a normal mood and affect. Her behavior is normal. Judgment and thought content normal.  Assessment/Plan Spondylolisthesis L4-L5.  Decompression and fusion L4-L5.  [name] Fields 04/03/2014, 7:24 AM

## 2014-04-03 NOTE — Progress Notes (Signed)
Subjective: Patient reports Offers no significant complaints. Legs feel well.  Objective: Vital signs in last 24 hours: Temp:  [97.2 F (36.2 C)-98 F (36.7 C)] 98 F (36.7 C) (08/17 1345) Pulse Rate:  [60-87] 87 (08/17 1345) Resp:  [13-24] 23 (08/17 1345) BP: (108-176)/(44-77) 125/75 mmHg (08/17 1345) SpO2:  [100 %] 100 % (08/17 1345) Weight:  [46.579 kg (102 lb 11 oz)] 46.579 kg (102 lb 11 oz) (08/17 0608)  Intake/Output from previous day:   Intake/Output this shift: Total I/O In: 1600 [I.V.:1600] Out: 550 [Urine:500; Blood:50]  Neurologic: Grossly normal Resp: No respiratory distress Incision is clean and dry  Lab Results: No results found for this basename: WBC, HGB, HCT, PLT,  in the last 72 hours BMET No results found for this basename: NA, K, CL, CO2, GLUCOSE, BUN, CREATININE, CALCIUM,  in the last 72 hours  Studies/Results: Dg Lumbar Spine 2-3 Views  04/03/2014   CLINICAL DATA:  Spinal stenosis.  EXAM: LUMBAR SPINE - 2-3 VIEW; DG C-ARM 61-120 MIN  COMPARISON:  MRI dated 02/08/2014  FINDINGS: AP and lateral C-arm images demonstrate the patient has undergone interbody and posterior fusion at L4-5. Hardware appears in good position. Slight residual spondylolisthesis at L4-5.  IMPRESSION: Fusion performed at L4-5.   Electronically Signed   By: Geanie CooleyJim  Maxwell M.D.   On: 04/03/2014 10:30   Dg Lumbar Spine 1 View  04/03/2014   CLINICAL DATA:  Spinal stenosis and spondylolisthesis.  EXAM: LUMBAR SPINE - 1 VIEW  COMPARISON:  MRI dated 02/08/2014  FINDINGS: There are instruments on the spinous processes of L4 and L5.  IMPRESSION: Instruments at L4 and L5.   Electronically Signed   By: Geanie CooleyJim  Maxwell M.D.   On: 04/03/2014 10:29   Dg C-arm 1-60 Min  04/03/2014   CLINICAL DATA:  Spinal stenosis.  EXAM: LUMBAR SPINE - 2-3 VIEW; DG C-ARM 61-120 MIN  COMPARISON:  MRI dated 02/08/2014  FINDINGS: AP and lateral C-arm images demonstrate the patient has undergone interbody and posterior fusion  at L4-5. Hardware appears in good position. Slight residual spondylolisthesis at L4-5.  IMPRESSION: Fusion performed at L4-5.   Electronically Signed   By: Geanie CooleyJim  Maxwell M.D.   On: 04/03/2014 10:30    Assessment/Plan: Stable postop  LOS: 0 days  Dr. Lovell SheehanJenkins will see patient in my absence.   Cereniti Curb J 04/03/2014, 3:54 PM

## 2014-04-03 NOTE — Clinical Social Work Note (Signed)
CSW consulted for possible SNF placement at time of discharge. CSW to continue to follow for discharge disposition per PT/OT evaluation. Thank you for the referral.  Marcelline DeistEmily Karrigan Messamore, MSW, [facility] Licensed Clinical Social Worker [phone] and [phone] [phone]

## 2014-04-03 NOTE — Transfer of Care (Signed)
Immediate Anesthesia Transfer of Care Note  Patient: [name] Fields  Procedure(s) Performed: Procedure(s) with comments: POSTERIOR LUMBAR FUSION 1 LEVEL/L4-5 Posterior lumbar interbody fusion (N/A) - L4-5 Posterior lumbar interbody fusion  Patient Location: PACU  Anesthesia Type:General  Level of Consciousness: sedated and responds to stimulation  Airway & Oxygen Therapy: Patient Spontanous Breathing and Patient connected to face mask oxygen  Post-op Assessment: Report given to PACU RN and Post -op Vital signs reviewed and stable  Post vital signs: Reviewed and stable  Complications: No apparent anesthesia complications

## 2014-04-03 NOTE — Anesthesia Preprocedure Evaluation (Addendum)
Anesthesia Evaluation  Patient identified by MRN, date of birth, ID band Patient awake    Reviewed: Allergy & Precautions, H&P , NPO status , Patient's Chart, lab work & pertinent test results, reviewed documented beta blocker date and time   History of Anesthesia Complications Negative for: history of anesthetic complications  Airway Mallampati: III TM Distance: <3 FB Neck ROM: Limited    Dental  (+) Teeth Intact, Dental Advisory Given   Pulmonary neg pulmonary ROS,  breath sounds clear to auscultation  Pulmonary exam normal       Cardiovascular hypertension, Pt. on medications and Pt. on home beta blockers - angina+ CAD (single vessel disease, 70% D1) + Valvular Problems/Murmurs (aortic sclerosis without stenosis) Rhythm:Regular Rate:Normal  '04 Stress test: normal '04 Echo: normal   Neuro/Psych Chronic back pain: narcotics    GI/Hepatic Neg liver ROS, GERD-  Controlled,  Endo/Other  negative endocrine ROS  Renal/GU negative Renal ROS     Musculoskeletal   Abdominal   Peds  Hematology negative hematology ROS (+)   Anesthesia Other Findings   Reproductive/Obstetrics                          Anesthesia Physical Anesthesia Plan  ASA: III  Anesthesia Plan: General   Post-op Pain Management:    Induction: Intravenous  Airway Management Planned: Oral ETT and Video Laryngoscope Planned  Additional Equipment:   Intra-op Plan:   Post-operative Plan: Extubation in OR  Informed Consent: I have reviewed the patients History and Physical, chart, labs and discussed the procedure including the risks, benefits and alternatives for the proposed anesthesia with the patient or authorized representative who has indicated his/her understanding and acceptance.   Dental advisory given  Plan Discussed with: Surgeon and CRNA  Anesthesia Plan Comments: (Plan routine monitors, GETA with VideoGlide  intubation)        Anesthesia Quick Evaluation

## 2014-04-03 NOTE — Op Note (Signed)
Date of surgery: 04/03/2014 Preoperative diagnosis: L4-L5 spondylolisthesis with stenosis, severe radiculopathy Postoperative diagnosis: L4-L5 spondylolisthesis with stenosis, severe radiculopathy Procedure: L4-L5 laminectomy, decompression of the L4 and the L5 nerve roots with more work than require for simple interbody arthrodesis. Posterior lumbar interbody arthrodesis using peek spacers local autograft and allograft L4-L5, posterior lateral arthrodesis with local autograft and allograft L4-L5, ankle screw fixation L4-L5. Surgeon: Barnett Abu First assistant: Delma Officer M.D. Anesthesia: Gen. endotracheal Indications: [name] Fields is a 80 year old individual who has had significant spondylolisthesis at L4-L5. She had been managed for years with conservative efforts. Recently she had developed severe deterioration with increased pain weakness in lower extremities. She has had inability to walk for last several weeks. She's been advised regarding the need for surgical decompression arthrodesis.  Procedure: Patient is brought to the operating room supine on a stretcher. After the smooth induction of general endotracheal anesthesia, she was turned prone onto the operating table. The back was prepped with alcohol and DuraPrep and draped in a sterile fashion. Fluoroscopic guidance was used to localize the region of L4-L5 after a midline incision was created and carried down to the lumbar dorsal fascia. A subperiosteal dissection was carried out in the interlaminar space at L4-L5. Self-retaining retractor was placed into the wound. The area over the facet joints at L3-4 and L4-5 were exposed and packed off for later grafting for posterior lateral arthrodesis. Laminectomy was then created removing the entire marginal lamina of L4 out to and including the entirety of the facet joint. The yellow ligament was thickened and redundant and calcified in this region and this was carefully dissected. The underlying  dural tube was identified and dissected. The dissection was carried out laterally to expose the L4 nerve root superiorly and the L5 nerve root inferiorly. These were severely stenotic and were decompressed using a 2 and 3 mm Kerrison punch in addition to the high-speed drill to thin the bone around the overlying compression. There was noted to be a severe spondylolisthesis seemingly worse on the left than on the right side. This was gradually decompressed. The nerve roots were each protected carefully. Once the decompression was completed the disc space was then opened using a 15 blade to incise the posterior longitudinal ligament and then doing a dissection inside the disc space using a combination of curettes and rongeurs. Endplates were decorticated using a toothed curette and a series of bone cutting instruments. Interbody spacer then used first on the left side in the right side to expose this and expand the interspace to approximately 10 mm size. This helped reduce the spondylolisthesis. Ultimately an 8 mm 8 lordotic 23 mm long spacer could be placed into the interspace. Once interspace was weekly clean to peek spacers of that size were placed into the interspace after being packed with autograft and allograft the allograft component was osseous cell. 10 cc of osseous cell was used in total. Interspace was then filled with the grafting material in the opposite spacer was placed. Radiographic confirmation of placement of spacers was obtained. Pedicle entry sites were then chosen at L4 and L5 6.5 x 40 mm screws were placed individually in the left and the right at L4 and left and right at L5. He screw hole was sounded tapped checked for cut out and then the screw was placed. 40 mm precontoured rods were then used to connect the screw heads. Positioning and reduction was checked radiographically in addition to visually. The lateral gutters were packed with the remaining  autograft and allograft mixture  bilaterally. Hemostasis was then checked and the soft tissues the decompression of the nerve roots at L4 and L5 were equally checked common dural tube was inspected when hemostasis was adequate the lumbar dorsal fascia was closed with #1 Vicryl in interrupted fashion 2-0 Vicryl was used in the subcutaneous tissues 3-0 Vicryl was used to close subcuticular skin. Blood loss was estimated at about 150 cc. Patient tolerated seizure was returned to recovery room in stable condition.

## 2014-04-03 NOTE — Anesthesia Postprocedure Evaluation (Signed)
  Anesthesia Post-op Note  Patient: [name] Fields  Procedure(s) Performed: Procedure(s) with comments: POSTERIOR LUMBAR FUSION 1 LEVEL/L4-5 Posterior lumbar interbody fusion (N/A) - L4-5 Posterior lumbar interbody fusion  Patient Location: PACU  Anesthesia Type:General  Level of Consciousness: awake, alert , oriented and patient cooperative  Airway and Oxygen Therapy: Patient Spontanous Breathing and Patient connected to nasal cannula oxygen  Post-op Pain: none  Post-op Assessment: Post-op Vital signs reviewed, Patient's Cardiovascular Status Stable, Respiratory Function Stable, Patent Airway, No signs of Nausea or vomiting and Pain level controlled  Post-op Vital Signs: Reviewed and stable  Last Vitals:  Filed Vitals:   04/03/14 1100  BP: 134/60  Pulse: 71  Temp:   Resp: 20    Complications: No apparent anesthesia complications

## 2014-04-04 DIAGNOSIS — Q762 Congenital spondylolisthesis: Secondary | ICD-10-CM

## 2014-04-04 DIAGNOSIS — M47817 Spondylosis without myelopathy or radiculopathy, lumbosacral region: Secondary | ICD-10-CM

## 2014-04-04 LAB — CBC
HCT: 32.3 % — ABNORMAL LOW (ref 36.0–46.0)
Hemoglobin: 10.8 g/dL — ABNORMAL LOW (ref 12.0–15.0)
MCH: 29 pg (ref 26.0–34.0)
MCHC: 33.4 g/dL (ref 30.0–36.0)
MCV: 86.8 fL (ref 78.0–100.0)
PLATELETS: 268 10*3/uL (ref 150–400)
RBC: 3.72 MIL/uL — AB (ref 3.87–5.11)
RDW: 14.4 % (ref 11.5–15.5)
WBC: 10 10*3/uL (ref 4.0–10.5)

## 2014-04-04 LAB — BASIC METABOLIC PANEL
ANION GAP: 11 (ref 5–15)
BUN: 11 mg/dL (ref 6–23)
CALCIUM: 8.7 mg/dL (ref 8.4–10.5)
CO2: 23 meq/L (ref 19–32)
Chloride: 108 mEq/L (ref 96–112)
Creatinine, Ser: 0.62 mg/dL (ref 0.50–1.10)
GFR calc Af Amer: >90 mL/min (ref >90)
GFR, EST NON AFRICAN AMERICAN: 79 mL/min — AB (ref >90)
Glucose, Bld: 117 mg/dL — ABNORMAL HIGH (ref 70–99)
Potassium: 3.8 mEq/L (ref 3.7–5.3)
SODIUM: 142 meq/L (ref 137–147)

## 2014-04-04 NOTE — Consult Note (Signed)
Physical Medicine and Rehabilitation Consult   Reason for Consult: L4/L5 spondylolisthesis with LE pain and weakness Referring Physician: Dr. Danielle Dess.    HPI: [name] Fields is a 80 y.o. female with history of HTN, recent sacral fracture (d/c from [facility] 02/23/14), degenerative  spondylolisthesis at L4-L5 with decline in overall mobility and severe pain in the past month. Mobility has steadily declined and patient has been wheelchair bound for past few weeks due to BLE pain and weakness. Patient elected to undergo L4-L5 decompressive laminectomy by Dr. Danielle Dess on 04/03/14. PT/OT evaluations to be done today. MD recommending CIR.    Review of Systems  HENT: Negative for hearing loss.   Eyes: Negative for blurred vision and double vision.  Respiratory: Negative for shortness of breath and wheezing.   Cardiovascular: Negative for chest pain and palpitations.  Gastrointestinal: Negative for heartburn and nausea.  Musculoskeletal: Positive for back pain and myalgias.  Neurological: Positive for dizziness (occasionally) and focal weakness (RLE weakness). Negative for headaches.  Psychiatric/Behavioral: The patient is not nervous/anxious.      Past Medical History  Diagnosis Date  . Coronary artery disease     1st diag. stenosis of 70%  . Hyperlipidemia   . Hypertension   . Aortic stenosis     mild  . Chest pain   . Osteoporosis   . Spinal stenosis   . Shingles   . Arthritis   . Hx stress fracture     Lumbar 4-5   Past Surgical History  Procedure Laterality Date  . Cardiac catheterization  10/18/2007    mild to moderate CAD,aggressive medical therapy  . Abdominal surgery      bowel obstruction  . Eye surgery      cataracts bil    History reviewed. No pertinent family history.   Social History:  Lives alone. Independent PTA. 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 Stopped driving due to recent issues with RLE weakness. She reports that she has never smoked.  She does not have any smokeless tobacco history on file. She reports that she does not drink alcohol or use illicit drugs.   Allergies  Allergen Reactions  . Other Hives    Paper Tape   Medications Prior to Admission  Medication Sig Dispense Refill  . aspirin EC 81 MG tablet Take 81 mg by mouth daily.      Marland Kitchen HYDROcodone-acetaminophen (NORCO/VICODIN) 5-325 MG per tablet Take 1-2 tablets by mouth every 6 (six) hours as needed for moderate pain.      . metoprolol tartrate (LOPRESSOR) 25 MG tablet Take 25 mg by mouth daily. For HTN      . oxyCODONE-acetaminophen (PERCOCET/ROXICET) 5-325 MG per tablet Take 1-2 tablets by mouth every 4 (four) hours as needed for severe pain. Hold for sedation        Home: Home Living Family/patient expects to be discharged to:: Private residence Living Arrangements: Alone  Functional History:   Functional Status:  Mobility:          ADL:    Cognition: Cognition Orientation Level: Oriented X4    Blood pressure 114/44, pulse 85, temperature 98.1 F (36.7 C), temperature source Oral, resp. rate 20, height 5' (1.524 m), weight 46.5 kg (102 lb 8.2 oz), SpO2 100.00%. Physical Exam  Nursing note and vitals reviewed. Constitutional: She is oriented to person, place, and time. She appears well-developed and well-nourished.  HENT:  Head: Normocephalic and atraumatic.  Eyes: Conjunctivae are normal. Pupils  are equal, round, and reactive to light.  Neck: Normal range of motion. Neck supple.  Cardiovascular: Normal rate and regular rhythm.   Respiratory: Effort normal and breath sounds normal.  GI: Soft. Bowel sounds are normal. She exhibits no distension. There is no tenderness.  Musculoskeletal: She exhibits no edema and no tenderness.  Neurological: She is alert and oriented to person, place, and time.  Mild right adf and apf weakness. No sensory loss. Pain inhibition weakness with proximal LE muscles use.  Skin: Skin is warm and dry.    Psychiatric: She has a normal mood and affect. Her behavior is normal. Judgment and thought content normal.    Results for orders placed during the hospital encounter of 04/03/14 (from the past 24 hour(s))  CBC     Status: Abnormal   Collection Time    04/04/14  5:05 AM      Result Value Ref Range   WBC 10.0  4.0 - 10.5 K/uL   RBC 3.72 (*) 3.87 - 5.11 MIL/uL   Hemoglobin 10.8 (*) 12.0 - 15.0 g/dL   HCT 40.932.3 (*) 81.136.0 - 91.446.0 %   MCV 86.8  78.0 - 100.0 fL   MCH 29.0  26.0 - 34.0 pg   MCHC 33.4  30.0 - 36.0 g/dL   RDW 78.214.4  95.611.5 - 21.315.5 %   Platelets 268  150 - 400 K/uL  BASIC METABOLIC PANEL     Status: Abnormal   Collection Time    04/04/14  5:05 AM      Result Value Ref Range   Sodium 142  137 - 147 mEq/L   Potassium 3.8  3.7 - 5.3 mEq/L   Chloride 108  96 - 112 mEq/L   CO2 23  19 - 32 mEq/L   Glucose, Bld 117 (*) 70 - 99 mg/dL   BUN 11  6 - 23 mg/dL   Creatinine, Ser 0.860.62  0.50 - 1.10 mg/dL   Calcium 8.7  8.4 - 57.810.5 mg/dL   GFR calc non Af Amer 79 (*) >90 mL/min   GFR calc Af Amer >90  >90 mL/min   Anion gap 11  5 - 15   Dg Lumbar Spine 2-3 Views  04/03/2014   CLINICAL DATA:  Spinal stenosis.  EXAM: LUMBAR SPINE - 2-3 VIEW; DG C-ARM 61-120 MIN  COMPARISON:  MRI dated 02/08/2014  FINDINGS: AP and lateral C-arm images demonstrate the patient has undergone interbody and posterior fusion at L4-5. Hardware appears in good position. Slight residual spondylolisthesis at L4-5.  IMPRESSION: Fusion performed at L4-5.   Electronically Signed   By: Geanie CooleyJim  Maxwell M.D.   On: 04/03/2014 10:30   Dg Lumbar Spine 1 View  04/03/2014   CLINICAL DATA:  Spinal stenosis and spondylolisthesis.  EXAM: LUMBAR SPINE - 1 VIEW  COMPARISON:  MRI dated 02/08/2014  FINDINGS: There are instruments on the spinous processes of L4 and L5.  IMPRESSION: Instruments at L4 and L5.   Electronically Signed   By: Geanie CooleyJim  Maxwell M.D.   On: 04/03/2014 10:29   Dg C-arm 1-60 Min  04/03/2014   CLINICAL DATA:  Spinal stenosis.   EXAM: LUMBAR SPINE - 2-3 VIEW; DG C-ARM 61-120 MIN  COMPARISON:  MRI dated 02/08/2014  FINDINGS: AP and lateral C-arm images demonstrate the patient has undergone interbody and posterior fusion at L4-5. Hardware appears in good position. Slight residual spondylolisthesis at L4-5.  IMPRESSION: Fusion performed at L4-5.   Electronically Signed   By: Geanie CooleyJim  Maxwell  M.D.   On: 04/03/2014 10:30    Assessment/Plan: Diagnosis: lumbar spondylolisthesis s/p decompression/lami L4-5 1. Does the need for close, 24 hr/day medical supervision in concert with the patient's rehab needs make it unreasonable for this patient to be served in a less intensive setting? Potentially/no 2. Co-Morbidities requiring supervision/potential complications: AS, HTN 3. Due to bladder management, bowel management, safety, skin/wound care, disease management, medication administration, pain management and patient education, does the patient require 24 hr/day rehab nursing? No and Potentially 4. Does the patient require coordinated care of a physician, rehab nurse, PT, OT to address physical and functional deficits in the context of the above medical diagnosis(es)? No Addressing deficits in the following areas: balance, endurance, locomotion, strength, transferring, bowel/bladder control, bathing, dressing, feeding, grooming and toileting 5. Can the patient actively participate in an intensive therapy program of at least 3 hrs of therapy per day at least 5 days per week? Potentially 6. The potential for patient to make measurable gains while on inpatient rehab is fair 7. Anticipated functional outcomes upon discharge from inpatient rehab are n/a  with PT, n/a with OT, n/a with SLP. 8. Estimated rehab length of stay to reach the above functional goals is: n/a 9. Does the patient have adequate social supports to accommodate these discharge functional goals? No 10. Anticipated D/C setting: Other 11. Anticipated post D/C treatments: HH  therapy 12. Overall Rehab/Functional Prognosis: excellent  RECOMMENDATIONS: This patient's condition is appropriate for continued rehabilitative care in the following setting: SNF Patient has agreed to participate in recommended program. Yes Note that insurance prior authorization may be required for reimbursement for recommended care.  Comment: Pt lacks medical necessity and dispo for a brief CIR admit. She is comfortable with [facility] which is probably a better fit given her situation.  Ranelle Oyster, MD, [facility] Physical Medicine & Rehabilitation     04/04/2014

## 2014-04-04 NOTE — Progress Notes (Signed)
Utilization review completed.  

## 2014-04-04 NOTE — Progress Notes (Signed)
Rehab admissions - Evaluated for possible admission.  Please see rehab consult.  Lacks medical necessity to support inpatient rehab stay.  Recommendations are for SNF and [facility]  Call me for questions.  #[phone]

## 2014-04-04 NOTE — Evaluation (Signed)
Physical Therapy Evaluation Patient Details Name: [name] Fields MRN: [id number] DOB: October 22, 1924 Today's Date: 04/04/2014   History of Present Illness  80 y.o. female admitted to [facility] on 04/03/14 s/p elective PLIF L4/5.  PMHx includes: CAD, HTN, lumbar stress fx, and falls.    Clinical Impression  Pt is POD #1 and is moving well, however, weakness is evident in the right leg requiring constant hands on assist during longer distance gait to prevent a fall.  She would benefit from SNF level rehab before returning home where she lives alone and only has her daughter's occasional assist.  She has been to [facility] in the past and is requesting we search there.   PT to follow acutely for deficits listed below.       Follow Up Recommendations SNF (has been to [facility] before)    Equipment Recommendations  None recommended by PT    Recommendations for Other Services   NA    Precautions / Restrictions Precautions Precautions: Fall;Back Precaution Booklet Issued: Yes (comment) Precaution Comments: reinforced precautions from handout given by OT Required Braces or Orthoses: Spinal Brace Spinal Brace: Lumbar corset;Applied in sitting position      Mobility  Bed Mobility Overal bed mobility: Needs Assistance Bed Mobility: Sit to Sidelying     Supine to sit: Min assist   Sit to sidelying: Min assist General bed mobility comments: Min assist to support legs to get back into bed.  Max veral cues for correct technique.   Transfers Overall transfer level: Modified independent Equipment used: Rolling walker (2 wheeled) Transfers: Sit to/from Stand Sit to Stand: Min guard         General transfer comment: Min guard assist with RW and verbal cues for safe hand placement as pt wants to pull with both hands on RW from lower chair.   Ambulation/Gait Ambulation/Gait assistance: Min assist Ambulation Distance (Feet): 80 Feet Assistive device: Rolling walker (2 wheeled) Gait  Pattern/deviations: Step-through pattern;Shuffle Gait velocity: decreased   General Gait Details: Pt with signs of right leg weakness during gait including 2 instances of buckling requiring min assist to support trunk to prevent LOB.  No foot drag on the right.  Pt needs verbal cues for safe use of DME and upright posutre.       Balance Overall balance assessment: Needs assistance Sitting-balance support: Feet supported;Bilateral upper extremity supported Sitting balance-Leahy Scale: Fair     Standing balance support: Bilateral upper extremity supported;No upper extremity supported;Single extremity supported Standing balance-Leahy Scale: Fair                               Pertinent Vitals/Pain Pain Assessment: Faces Faces Pain Scale: Hurts little more Pain Location: back and right groin Pain Descriptors / Indicators: Aching;Sore Pain Intervention(s): Monitored during session;Repositioned;Patient requesting pain meds-RN notified    Home Living Family/patient expects to be discharged to:: Private residence Living Arrangements: Alone Available Help at Discharge: Friend(s);Family;Available PRN/intermittently Type of Home: House Home Access: Level entry     Home Layout: One level Home Equipment: Walker - 2 wheels;Cane - single point;Wheelchair - manual;Tub bench;Bedside commode;Hand held shower head;Grab bars - tub/shower      Prior Function Level of Independence: Independent with assistive device(s)         Comments: WC level due to pain in R leg.      Hand Dominance   Dominant Hand: Right    Extremity/Trunk Assessment  Upper Extremity Assessment: Defer to OT evaluation           Lower Extremity Assessment: Generalized weakness      Cervical / Trunk Assessment: Kyphotic  Communication   Communication: No difficulties  Cognition Arousal/Alertness: Awake/alert Behavior During Therapy: WFL for tasks assessed/performed Overall Cognitive Status:  Within Functional Limits for tasks assessed Area of Impairment: Memory     Memory: Decreased short-term memory         General Comments: Pt asking repetitive questions. Pt with fiar recall of back precautions             Assessment/Plan    PT Assessment Patient needs continued PT services  PT Diagnosis Difficulty walking;Abnormality of gait;Generalized weakness;Acute pain   PT Problem List Decreased strength;Decreased activity tolerance;Decreased balance;Decreased mobility;Decreased knowledge of use of DME;Decreased knowledge of precautions;Pain  PT Treatment Interventions DME instruction;Gait training;Therapeutic activities;Functional mobility training;Therapeutic exercise;Balance training;Neuromuscular re-education;Patient/family education;Modalities   PT Goals (Current goals can be found in the Care Plan section) Acute Rehab PT Goals Patient Stated Goal: to go to rehab like she did before PT Goal Formulation: With patient Time For Goal Achievement: 04/11/14 Potential to Achieve Goals: Good    Frequency Min 5X/week   Barriers to discharge Decreased caregiver support pt does not have 24/7 assist at discharge.        End of Session Equipment Utilized During Treatment: Back brace;Gait belt Activity Tolerance: Patient limited by pain Patient left: in bed;with call bell/phone within reach;with bed alarm set           Time: [id number] PT Time Calculation (min): 29 min   Charges:   PT Evaluation $Initial PT Evaluation Tier I: 1 Procedure PT Treatments $Gait Training: 8-22 mins     Rain Wilhide B. Kyliegh Jester, PT, DPT [phone]   04/04/2014, 12:34 PM

## 2014-04-04 NOTE — Evaluation (Signed)
Occupational Therapy Evaluation Patient Details Name: [name] BrasJacqueline N Waxman MRN: [id number] DOB: 1925/02/21 Today's Date: 04/04/2014    History of Present Illness 80 yo female s/p PLIF L4-5 with back brace. [facility] and has progressive declined requiring use of w/c for 6 weeks at home. Pt was a cane level upon d/c from SNF    Clinical Impression   Patient is s/p PLIF L4-5 surgery resulting in functional limitations due to the deficits listed below (see OT problem list). PTA living at home alone for 6 weeks with progressive delay.  Patient will benefit from skilled OT acutely to increase independence and safety with ADLS to allow discharge CIR. Pt could benefit from CIR to work on balance during adls with back precautions.      Follow Up Recommendations  Other (comment) ([facility]    Equipment Recommendations  None recommended by OT    Recommendations for Other Services Rehab consult     Precautions / Restrictions Precautions Precautions: Fall;Back Precaution Comments: handout provided adn reviewed with patient Required Braces or Orthoses: Spinal Brace Spinal Brace: Lumbar corset;Applied in sitting position      Mobility Bed Mobility Overal bed mobility: Needs Assistance Bed Mobility: Supine to Sit     Supine to sit: Min assist     General bed mobility comments: Requires bed rail and cues for back precautions  Transfers Overall transfer level: Needs assistance Equipment used: Rolling walker (2 wheeled) Transfers: Sit to/from Stand Sit to Stand: Min guard         General transfer comment: cues for hand placement    Balance Overall balance assessment: Needs assistance Sitting-balance support: No upper extremity supported;Feet supported Sitting balance-Leahy Scale: Fair     Standing balance support: During functional activity;No upper extremity supported Standing balance-Leahy Scale: Fair                               ADL Overall ADL's : Needs assistance/impaired Eating/Feeding: Modified independent;Bed level   Grooming: Wash/dry hands;Min guard;Standing       Lower Body Bathing: Sit to/from stand;Min guard (a) Lower Body Bathing Details (indicate cue type and reason): able to cross BIL LE sitting         Toilet Transfer: RW;Ambulation;Min guard           Functional mobility during ADLs: Rolling walker;Minimal assistance General ADL Comments: Pt educated on bed mobilitya dn back precations. pt with excelletn return demo. pt brushing teeth seated and cues to avoid bending.      Vision                     Perception     Praxis      Pertinent Vitals/Pain Pain Assessment: Faces Faces Pain Scale: Hurts little more Pain Intervention(s): Repositioned     Hand Dominance Right   Extremity/Trunk Assessment Upper Extremity Assessment Upper Extremity Assessment: Overall WFL for tasks assessed   Lower Extremity Assessment Lower Extremity Assessment: Defer to PT evaluation       Communication Communication Communication: No difficulties   Cognition Arousal/Alertness: Awake/alert Behavior During Therapy: WFL for tasks assessed/performed Overall Cognitive Status: Within Functional Limits for tasks assessed Area of Impairment: Memory     Memory: Decreased short-term memory         General Comments: Pt asking repetitive questions. Pt with fiar recall of back precautions   General Comments  Exercises       Shoulder Instructions      Home Living Family/patient expects to be discharged to:: Private residence Living Arrangements: Alone Available Help at Discharge: Friend(s);Family;Available PRN/intermittently Type of Home: House Home Access: Level entry     Home Layout: One level     Bathroom Shower/Tub: Chief Strategy Officer: Standard     Home Equipment: Environmental consultant - 2 wheels;Cane - single point;Wheelchair -  manual;Tub bench;Bedside commode;Hand held shower head;Grab bars - tub/shower          Prior Functioning/Environment Level of Independence: Independent with assistive device(s)        Comments: Pt was MOD with cane 6 weeks ago. Pt since that time has been w/c level due to pain in RT LE    OT Diagnosis: Generalized weakness;Acute pain;Cognitive deficits   OT Problem List: Decreased strength;Decreased activity tolerance;Impaired balance (sitting and/or standing);Decreased cognition;Decreased safety awareness;Decreased knowledge of use of DME or AE;Decreased knowledge of precautions;Pain   OT Treatment/Interventions: Self-care/ADL training;Therapeutic exercise;DME and/or AE instruction;Therapeutic activities;Cognitive remediation/compensation;Patient/family education;Balance training    OT Goals(Current goals can be found in the care plan section) Acute Rehab OT Goals Patient Stated Goal: to return hom OT Goal Formulation: With patient Time For Goal Achievement: 04/18/14 Potential to Achieve Goals: Good  OT Frequency: Min 2X/week   Barriers to D/C: Decreased caregiver support  pt has PRN assistance from neighbors and daughter in law. pt wants to return home and very anxious with discussion of d/c plans        Co-evaluation              End of Session Equipment Utilized During Treatment: Rolling walker;Gait belt Nurse Communication: Mobility status;Precautions  Activity Tolerance: Patient tolerated treatment well Patient left: with call bell/phone within reach;in chair (pt return to bed from chair)   Time: 4098-1191 OT Time Calculation (min): 46 min Charges:  OT General Charges $OT Visit: 1 Procedure OT Evaluation $Initial OT Evaluation Tier I: 1 Procedure OT Treatments $Self Care/Home Management : 23-37 mins G-Codes:    Harolyn Rutherford 04-19-14, 9:42 AM Pager: [phone]

## 2014-04-05 ENCOUNTER — Encounter (HOSPITAL_COMMUNITY)
Admission: RE | Disposition: A | Discharge status: Skilled Nursing Facility | Payer: Self-pay | Source: Ambulatory Visit | Attending: Neurological Surgery

## 2014-04-05 LAB — URINE MICROSCOPIC-ADD ON

## 2014-04-05 LAB — URINALYSIS, ROUTINE W REFLEX MICROSCOPIC
BILIRUBIN URINE: NEGATIVE
Glucose, UA: NEGATIVE mg/dL
Hgb urine dipstick: NEGATIVE
KETONES UR: NEGATIVE mg/dL
Leukocytes, UA: NEGATIVE
NITRITE: POSITIVE — AB
PROTEIN: NEGATIVE mg/dL
Specific Gravity, Urine: 1.011 (ref 1.005–1.030)
Urobilinogen, UA: 0.2 mg/dL (ref 0.0–1.0)
pH: 7 (ref 5.0–8.0)

## 2014-04-05 SURGERY — HEMIARTHROPLASTY, HIP, DIRECT ANTERIOR APPROACH, FOR FRACTURE
Anesthesia: General | Laterality: Right

## 2014-04-05 MED ORDER — SULFAMETHOXAZOLE-TMP DS 800-160 MG PO TABS
1.0000 | ORAL_TABLET | Freq: Two times a day (BID) | ORAL | Status: DC
  Administered 2014-04-05 – 2014-04-06 (×3): 1 via ORAL
  Filled 2014-04-05 (×3): qty 1

## 2014-04-05 NOTE — Progress Notes (Signed)
Pt notified on her suspicious urine results as she was getting her Bactrim; pt report having frequent urine prior to coming in the hospital at home. Arabella MerlesP. Amo Mickenzie Stolar RN.

## 2014-04-05 NOTE — Progress Notes (Signed)
The patient's urinalysis is suspicious for urinary tract infection. I will start Bactrim. We will await the cultures.

## 2014-04-05 NOTE — Progress Notes (Signed)
Physical Therapy Treatment Patient Details Name: [name] BrasJacqueline N Harbold MRN: [id number] DOB: 1925-02-02 Today's Date: 04/05/2014    History of Present Illness 80 y.o. female [facility] s/p elective PLIF L4/5.  PMHx includes: CAD, HTN, lumbar stress fx, and falls.      PT Comments    Pt is POD #2 and is not feeling as well as she did yesterday.  She reports she did not sleep well last night and exhaustion is playing into her not feeling as strong today.  We only walked in the room with the RW, but she was agreeable to OOB to chair.  She continues to be appropriate for SNF level rehab at discharge and PT will follow acutely.    Follow Up Recommendations  SNF     Equipment Recommendations  None recommended by PT    Recommendations for Other Services   NA     Precautions / Restrictions Precautions Precautions: Fall;Back Precaution Booklet Issued: Yes (comment) Precaution Comments: pt unable to report any of her back precautions.  PT verbally reinforced and put education handout in her reach.  Required Braces or Orthoses: Spinal Brace Spinal Brace: Lumbar corset;Applied in sitting position    Mobility  Bed Mobility Overal bed mobility: Needs Assistance Bed Mobility: Rolling;Sidelying to Sit Rolling: Min guard Sidelying to sit: Min guard       General bed mobility comments: Min guard assist to progress all the way over to sidelying and boost last 1/4th of side to sit move.  Verbal cues to reinforce log roll technique.    Transfers Overall transfer level: Needs assistance Equipment used: Rolling walker (2 wheeled) Transfers: Sit to/from Stand Sit to Stand: Min assist         General transfer comment: Min assist to support trunk over weak legs.  Pt reports, "I just feel so shaky today"  Ambulation/Gait Ambulation/Gait assistance: Min assist Ambulation Distance (Feet): 20 Feet Assistive device: Rolling walker (2 wheeled) Gait Pattern/deviations:  Step-through pattern;Shuffle Gait velocity: decreased Gait velocity interpretation: Below normal speed for age/gender General Gait Details: Pt needs verbal cues to stay inside of RW during gait, especially when turning.  Pt reports subjectively that she feels that her right leg is weaker, but no buckling noted today.  She did not feel strong enough to walk into the hallway right now.               Balance Overall balance assessment: Needs assistance Sitting-balance support: Feet supported;No upper extremity supported Sitting balance-Leahy Scale: Good     Standing balance support: Bilateral upper extremity supported Standing balance-Leahy Scale: Poor Standing balance comment: needs physical assist today                    Cognition Arousal/Alertness: Awake/alert Behavior During Therapy: WFL for tasks assessed/performed Overall Cognitive Status: Within Functional Limits for tasks assessed (not specifically tested, pt reporting hallucinations )                             Pertinent Vitals/Pain Pain Assessment: 0-10 Pain Score: 5  Pain Location: low back at bandage site Pain Descriptors / Indicators: Burning Pain Intervention(s): Repositioned;Limited activity within patient's tolerance;Monitored during session           PT Goals (current goals can now be found in the care plan section) Acute Rehab PT Goals Patient Stated Goal: to go to rehab like she did before Progress towards PT goals:  Not progressing toward goals - comment (weaker today, did not sleep well)    Frequency  Min 5X/week    PT Plan Current plan remains appropriate       End of Session Equipment Utilized During Treatment: Gait belt;Back brace Activity Tolerance: Patient limited by fatigue;Patient limited by pain Patient left: in chair;with call bell/phone within reach;with chair alarm set     Time: 9811-9147 PT Time Calculation (min): 23 min  Charges:  $Therapeutic Activity:  23-37 mins                      Secret Kristensen B. Thelma Viana, PT, DPT [phone]   04/05/2014, 10:41 AM

## 2014-04-05 NOTE — Progress Notes (Signed)
Subjective: Patient reports doing well except for incontinence of urine  Objective: Vital signs in last 24 hours: Temp:  [97.7 F (36.5 C)-101.2 F (38.4 C)] 99.3 F (37.4 C) (08/18 2337) Pulse Rate:  [82-96] 82 (08/18 2248) Resp:  [18-20] 18 (08/18 2248) BP: (129-140)/(51-95) 139/95 mmHg (08/18 2248) SpO2:  [98 %-100 %] 98 % (08/18 2248)  Intake/Output from previous day: 08/18 0701 - 08/19 0700 In: 363 [P.O.:360; I.V.:3] Out: -  Intake/Output this shift: Total I/O In: 3 [I.V.:3] Out: -   Physical Exam: Dressing CDI.  Moving legs well.  Denies perineal numbness  Lab Results:  Recent Labs  04/04/14 0505  WBC 10.0  HGB 10.8*  HCT 32.3*  PLT 268   BMET  Recent Labs  04/04/14 0505  NA 142  K 3.8  CL 108  CO2 23  GLUCOSE 117*  BUN 11  CREATININE 0.62  CALCIUM 8.7    Studies/Results: Dg Lumbar Spine 2-3 Views  04/03/2014   CLINICAL DATA:  Spinal stenosis.  EXAM: LUMBAR SPINE - 2-3 VIEW; DG C-ARM 61-120 MIN  COMPARISON:  MRI dated 02/08/2014  FINDINGS: AP and lateral C-arm images demonstrate the patient has undergone interbody and posterior fusion at L4-5. Hardware appears in good position. Slight residual spondylolisthesis at L4-5.  IMPRESSION: Fusion performed at L4-5.   Electronically Signed   By: Geanie CooleyJim  Maxwell M.D.   On: 04/03/2014 10:30   Dg Lumbar Spine 1 View  04/03/2014   CLINICAL DATA:  Spinal stenosis and spondylolisthesis.  EXAM: LUMBAR SPINE - 1 VIEW  COMPARISON:  MRI dated 02/08/2014  FINDINGS: There are instruments on the spinous processes of L4 and L5.  IMPRESSION: Instruments at L4 and L5.   Electronically Signed   By: Geanie CooleyJim  Maxwell M.D.   On: 04/03/2014 10:29   Dg C-arm 1-60 Min  04/03/2014   CLINICAL DATA:  Spinal stenosis.  EXAM: LUMBAR SPINE - 2-3 VIEW; DG C-ARM 61-120 MIN  COMPARISON:  MRI dated 02/08/2014  FINDINGS: AP and lateral C-arm images demonstrate the patient has undergone interbody and posterior fusion at L4-5. Hardware appears in good  position. Slight residual spondylolisthesis at L4-5.  IMPRESSION: Fusion performed at L4-5.   Electronically Signed   By: Geanie CooleyJim  Maxwell M.D.   On: 04/03/2014 10:30    Assessment/Plan: Will check U/A. Plan transfer to Rehab when bed available.  Continue to mobilize with PT.    LOS: 2 days    English Craighead D, MD 04/05/2014, 7:00 AM

## 2014-04-05 NOTE — Progress Notes (Signed)
Patient ID: [name] BrasJacqueline N Wieman, female   DOB: Mar 11, 1925, 80 y.o.   MRN: [id number] Subjective:  The patient is alert and pleasant. She looks well. [facility]  Objective: Vital signs in last 24 hours: Temp:  [98.4 F (36.9 C)-101.2 F (38.4 C)] 99.3 F (37.4 C) (08/19 1004) Pulse Rate:  [82-112] 112 (08/19 1004) Resp:  [18-20] 20 (08/19 1004) BP: (127-139)/(51-95) 127/62 mmHg (08/19 1004) SpO2:  [98 %-100 %] 99 % (08/19 1004)  Intake/Output from previous day: 08/18 0701 - 08/19 0700 In: 363 [P.O.:360; I.V.:3] Out: -  Intake/Output this shift:    Physical exam patient is alert and oriented. She is moving her lower extremities well.  Lab Results:  Recent Labs  04/04/14 0505  WBC 10.0  HGB 10.8*  HCT 32.3*  PLT 268   BMET  Recent Labs  04/04/14 0505  NA 142  K 3.8  CL 108  CO2 23  GLUCOSE 117*  BUN 11  CREATININE 0.62  CALCIUM 8.7    Studies/Results: No results found.  Assessment/Plan: The patient is doing well. She may be ready for rehabilitation tomorrow.  LOS: 2 days     Alston Berrie D 04/05/2014, 12:50 PM

## 2014-04-05 NOTE — Clinical Social Work Psychosocial (Signed)
Clinical Social Work Department BRIEF PSYCHOSOCIAL ASSESSMENT 04/05/2014  Patient:  [name] Fields,[name] Fields     Account Number:  [id number]     Admit date:  04/03/2014  Clinical Social Worker:  Mosie EpsteinVAUGHN,Asa Fath S, LCSWA  Date/Time:  04/05/2014 03:11 PM  Referred by:  Physician  Date Referred:  04/05/2014 Referred for  SNF Placement   Other Referral:   none.   Interview type:  Patient Other interview type:   none.    PSYCHOSOCIAL DATA Living Status:  ALONE Admitted from facility:   Level of care:   Primary support name:  [name] Fields and [name] Fields Primary support relationship to patient:  CHILD, ADULT Degree of support available:   Strong support system.    CURRENT CONCERNS Current Concerns  Post-Acute Placement   Other Concerns:   none.    SOCIAL WORK ASSESSMENT / PLAN CSW spoke with pt at bedside regarding discharge disposition. Pt stated she has previously [facility] and would like to return at time of discharge. CSW to continue to follow and assist with discharge planning needs.   Assessment/plan status:  Psychosocial Support/Ongoing Assessment of Needs Other assessment/ plan:   nonr.   Information/referral to community resources:   [facility]    PATIENT'S/FAMILY'S RESPONSE TO PLAN OF CARE: Pt understanding and agreeable to CSW plan of care. Pt expressed no further questions or concerns at this time.       Marcelline DeistEmily Yadiel Aubry, MSW, [facility] Licensed Clinical Social Worker [phone] and [phone] [phone]

## 2014-04-05 NOTE — Clinical Social Work Placement (Addendum)
Clinical Social Work Department CLINICAL SOCIAL WORK PLACEMENT NOTE 04/05/2014  Patient:  [name] Fields,[name] Fields  Account Number:  [id number] Admit date:  04/03/2014  Clinical Social Worker:  Mosie EpsteinEMILY S Shantavia Jha, LCSWA  Date/time:  04/05/2014 03:19 PM  Clinical Social Work is seeking post-discharge placement for this patient at the following level of care:   SKILLED NURSING   (*CSW will update this form in Epic as items are completed)   04/05/2014  Patient/family provided with [facility] Department of Clinical Social Work's list of facilities offering this level of care within the geographic area requested by the patient (or if unable, by the patient's family).  04/05/2014  Patient/family informed of their freedom to choose among providers that offer the needed level of care, that participate in Medicare, Medicaid or managed care program needed by the patient, have an available bed and are willing to accept the patient.  04/05/2014  Patient/family informed of [facility]' ownership interest in [facility], as well as of the fact that they are under no obligation to receive care at this facility.  PASARR submitted to EDS on  PASARR number received on   FL2 transmitted to all facilities in geographic area requested by pt/family on  04/05/2014 FL2 transmitted to all facilities within larger geographic area on   Patient informed that his/her managed care company has contracts with or will negotiate with  certain facilities, including the following:     Patient/family informed of bed offers received:  04/06/2014 Patient [facility] Physician recommends and patient chooses bed at    [facility] on  04/06/2014 Patient to be transferred to facility by personal car (with pt's son) Patient and family notified of transfer on 04/06/2014 Name of family member notified:  Elizabeth PalauJohn Moseley (pt's son) and pt updated at bedside.  The following physician  request were entered in Epic:   Additional Comments: PASARR previously existing.  Marcelline DeistEmily Creta Dorame, MSW, [facility] Licensed Clinical Social Worker [phone] and [phone] [phone]

## 2014-04-05 NOTE — Progress Notes (Signed)
Occupational Therapy Treatment Patient Details Name: [name] Fields MRN: [id number] DOB: [birth year] Today's Date: 04/05/2014    History of present illness 80 y.o. female admitted to [facility] on 04/03/14 s/p elective PLIF L4/5.  PMHx includes: CAD, HTN, lumbar stress fx, and falls.     OT comments  Pt requesting SNF [facility] for d/c planning. Pt with large LOB at sink level this session. Pt remains high fall risk at home and with poor recall of precautions. Question safety for long term return home v/s ALF benefits??? Pt demonstrates cognitive deficits.   Follow Up Recommendations  SNF    Equipment Recommendations  Other (comment) (defersnf)    Recommendations for Other Services      Precautions / Restrictions Precautions Precautions: Fall;Back Precaution Comments: poor recall of precautions Required Braces or Orthoses: Spinal Brace Spinal Brace: Lumbar corset;Applied in sitting position       Mobility Bed Mobility Overal bed mobility: Needs Assistance Bed Mobility: Sidelying to Sit;Rolling;Supine to Sit Rolling: Min assist Sidelying to sit: Min assist Supine to sit: Min assist     General bed mobility comments: pt needed cues for proper sequence. Pt attempting to twist and push with LT UE again bed surface behind pt. Pt encourage to roll and then push up with Rt UE to exit on the Rt side of the bed  Transfers Overall transfer level: Needs assistance Equipment used: Rolling walker (2 wheeled) Transfers: Sit to/from Stand Sit to Stand: Min assist         General transfer comment: cues for safety     Balance Overall balance assessment: Needs assistance         Standing balance support: No upper extremity supported;During functional activity Standing balance-Leahy Scale: Zero Standing balance comment: LOB mod (A)                   ADL Overall ADL's : Needs assistance/impaired Eating/Feeding: Modified independent;Sitting   Grooming: Wash/dry  hands;Moderate assistance;Cueing for safety;Standing Grooming Details (indicate cue type and reason): pt with large LOB at sink surface turning head to the left. Pt required MOD (A) to correct LOB and would have fallen without therapist present. Pt states "oh what happen?"                              Functional mobility during ADLs: Minimal assistance;Rolling walker General ADL Comments: Pt educated on back precautions with bed mobility, sink level grooming and transfers. PT with poor recall of information from session to session. Pt remains high fall risk. Pt reports "i am going to [facility] again"      Vision                     Perception     Praxis      Cognition   Behavior During Therapy: [facility] for tasks assessed/performed Overall Cognitive Status: Impaired/Different from baseline Area of Impairment: Memory     Memory: Decreased recall of precautions          General Comments: PT attempting to put brace on backwards.     Extremity/Trunk Assessment               Exercises     Shoulder Instructions       General Comments      Pertinent Vitals/ Pain       Pain Assessment:  (reports fatigue)  Home Living  Prior Functioning/Environment              Frequency Min 2X/week     Progress Toward Goals  OT Goals(current goals can now be found in the care plan section)  Progress towards OT goals: Progressing toward goals  Acute Rehab OT Goals Patient Stated Goal: to go to [facility] OT Goal Formulation: With patient Time For Goal Achievement: 04/18/14 Potential to Achieve Goals: Good ADL Goals Pt Will Perform Upper Body Bathing: with modified independence;standing Pt Will Perform Lower Body Bathing: with modified independence;sit to/from stand Pt Will Transfer to Toilet: with modified independence;bedside commode Pt Will Perform Tub/Shower Transfer: Tub  transfer;ambulating;tub bench;rolling walker;with modified independence  Plan Discharge plan needs to be updated    Co-evaluation                 End of Session Equipment Utilized During Treatment: Rolling walker;Gait belt   Activity Tolerance Patient tolerated treatment well   Patient Left in chair;with call bell/phone within reach;with chair alarm set   Nurse Communication Mobility status;Precautions        Time: 7564-33291147-1201 OT Time Calculation (min): 14 min  Charges: OT General Charges $OT Visit: 1 Procedure OT Treatments $Self Care/Home Management : 8-22 mins  Boone MasterJones, Selam Pietsch B 04/05/2014, 2:45 PM Pager: [phone]

## 2014-04-06 MED ORDER — OXYCODONE-ACETAMINOPHEN 5-325 MG PO TABS: 1.0000 | ORAL_TABLET | ORAL | Status: DC | PRN

## 2014-04-06 MED ORDER — DSS 100 MG PO CAPS: 100.0000 mg | ORAL_CAPSULE | Freq: Two times a day (BID) | ORAL | Status: DC

## 2014-04-06 MED ORDER — SULFAMETHOXAZOLE-TMP DS 800-160 MG PO TABS: 1.0000 | ORAL_TABLET | Freq: Two times a day (BID) | ORAL | Status: DC

## 2014-04-06 NOTE — Care Management Note (Addendum)
  Page 1 of 1   04/06/2014     10:37:08 AM CARE MANAGEMENT NOTE 04/06/2014  Patient:  Fernande BrasHANSEN,Cathryn N   Account Number:  [id number]  Date Initiated:  04/04/2014  Documentation initiated by:  Elmer BalesOBARGE,Reniya Mcclees  Subjective/Objective Assessment:   Patient was admitted for a PLIF. Lives at home alone.     Action/Plan:   Will follow for discharge needs pending PT/OT evals and physician orders.   Anticipated DC Date:     Anticipated DC Plan:  SKILLED NURSING FACILITY  In-house referral  Clinical Social Worker         Choice offered to / List presented to:             Status of service:  Completed, signed off Medicare Important Message given?  YES (If response is "NO", the following Medicare IM given date fields will be blank) Date Medicare IM given:  04/06/2014 Medicare IM given by:  Elmer BalesOBARGE,Kipp Shank Date Additional Medicare IM given:   Additional Medicare IM given by:    Discharge Disposition:    Per UR Regulation:  Reviewed for med. necessity/level of care/duration of stay  If discussed at Long Length of Stay Meetings, dates discussed:    Comments:  04/06/14 1005 Elmer Balesourtney Luciel Brickman RN, MSN, CM- Medicare IM letter provided.

## 2014-04-06 NOTE — Progress Notes (Signed)
Physical Therapy Treatment Patient Details Name: [name] Fields MRN: [id number] DOB: 1924-10-26 Today's Date: 04/06/2014    History of Present Illness 80 y.o. female admitted to [facility] on 04/03/14 s/p elective PLIF L4/5.  PMHx includes: CAD, HTN, lumbar stress fx, and falls.      PT Comments    Pt feels much better today and is progressing with her mobility nicely.  She continues to have bil leg weakness with gait, but was able to progress her ambulation distance today with assist and the RW.  She needs both verbal and functional reinforcement of her back precautions.  PT will continue to follow acutely.   Follow Up Recommendations  SNF     Equipment Recommendations  None recommended by PT    Recommendations for Other Services   NA     Precautions / Restrictions Precautions Precautions: Fall;Back Precaution Booklet Issued: Yes (comment) Precaution Comments: pt able to report 2/3 precautions today.  Needs continual functional reinforcement to identify when she is moving and breaking her precautions.  Required Braces or Orthoses: Spinal Brace Spinal Brace: Lumbar corset;Applied in sitting position    Mobility   Transfers Overall transfer level: Needs assistance Equipment used: Rolling walker (2 wheeled) Transfers: Sit to/from Stand Sit to Stand: Min guard         General transfer comment: Min guard assist for safety as she continues to report bil leg "shakiness".    Ambulation/Gait Ambulation/Gait assistance: Min guard Ambulation Distance (Feet): 150 Feet Assistive device: Rolling walker (2 wheeled) Gait Pattern/deviations: Step-through pattern;Shuffle;Trunk flexed Gait velocity: decreased Gait velocity interpretation: Below normal speed for age/gender General Gait Details: Verbal cues not to twist to look at things behind her during gait, verbal cues for safe RW use and verbal cues for upright posture during gait.  Pt feeling better today and wanting to walk further,  but continues to have weakness in bil legs R>L           Balance Overall balance assessment: Needs assistance Sitting-balance support: Feet supported;No upper extremity supported Sitting balance-Leahy Scale: Good     Standing balance support: Bilateral upper extremity supported Standing balance-Leahy Scale: Poor                      Cognition Arousal/Alertness: Awake/alert Behavior During Therapy: WFL for tasks assessed/performed Overall Cognitive Status: Impaired/Different from baseline Area of Impairment: Memory     Memory: Decreased recall of precautions;Decreased short-term memory                     Pertinent Vitals/Pain Pain Assessment: 0-10 Pain Score: 5  Pain Location: low back Pain Descriptors / Indicators: Aching Pain Intervention(s): Monitored during session;Repositioned           PT Goals (current goals can now be found in the care plan section) Acute Rehab PT Goals Patient Stated Goal: to go to [facility] Progress towards PT goals: Progressing toward goals    Frequency  Min 5X/week    PT Plan Current plan remains appropriate       End of Session Equipment Utilized During Treatment: Gait belt;Back brace Activity Tolerance: Patient limited by fatigue Patient left: in chair;with call bell/phone within reach;with chair alarm set     Time: 1004-1020 PT Time Calculation (min): 16 min  Charges:  $Gait Training: 8-22 mins                      Capri Veals B. Jullien Granquist, PT, DPT #  [phone]   04/06/2014, 10:58 AM

## 2014-04-06 NOTE — Progress Notes (Signed)
Pt's son concerned about pt's swallowing and decreased appetite. 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 MD made aware of son's concerns; no further orders obtained. Pt can follow up with PCP.

## 2014-04-06 NOTE — Discharge Summary (Signed)
Physician Discharge Summary  Patient ID: [name] Fields MRN: [id number] DOB/AGE: [birth year] 78 y.o.  Admit date: 04/03/2014 Discharge date: 04/06/2014  Admission Diagnoses: L4-5 spondylolisthesis, spinal stenosis, lumbago, lumbar radiculopathy  Discharge Diagnoses: The same Active Problems:   Spondylolisthesis at L4-L5 level   Discharged Condition: good  Hospital Course: Dr. Danielle Dess performed an L4-5 decompression, instrumentation, and fusion on the patient on 04/03/2014.  Patient's postoperative course was unremarkable except for urinary tract infection. She was started on Bactrim. Arrangements were made for her to go to [facility] for rehabilitation. She was transferred on 04/06/2014. The patient was given oral and written discharge instructions. All the patient's, and her son's, questions were answered.  Consults: PT Significant Diagnostic Studies: None Treatments: L4-5 decompression, instrumentation, and fusion Discharge Exam: Blood pressure 140/62, pulse 91, temperature 97.6 F (36.4 C), temperature source Oral, resp. rate 18, height 5' (1.524 m), weight 46.5 kg (102 lb 8.2 oz), SpO2 98.00%. Patient is alert and pleasant. Her strength is normal.  Disposition: [facility]  Discharge Instructions   Call MD for:  difficulty breathing, headache or visual disturbances    Complete by:  As directed      Call MD for:  extreme fatigue    Complete by:  As directed      Call MD for:  hives    Complete by:  As directed      Call MD for:  persistant dizziness or light-headedness    Complete by:  As directed      Call MD for:  persistant nausea and vomiting    Complete by:  As directed      Call MD for:  redness, tenderness, or signs of infection (pain, swelling, redness, odor or green/yellow discharge around incision site)    Complete by:  As directed      Call MD for:  severe uncontrolled pain    Complete by:  As directed      Call MD for:  temperature  >100.4    Complete by:  As directed      Diet - low sodium heart healthy    Complete by:  As directed      Discharge instructions    Complete by:  As directed   Call [phone] for a followup appointment. Take a stool softener while you are using pain medications.     Driving Restrictions    Complete by:  As directed   Do not drive for 2 weeks.     Increase activity slowly    Complete by:  As directed      Lifting restrictions    Complete by:  As directed   Do not lift more than 5 pounds. No excessive bending or twisting.     May shower / Bathe    Complete by:  As directed   He may shower after the pain she is removed 3 days after surgery. Leave the incision alone.     No dressing needed    Complete by:  As directed             Medication List    STOP taking these medications       HYDROcodone-acetaminophen 5-325 MG per tablet  Commonly known as:  NORCO/VICODIN      TAKE these medications       aspirin EC 81 MG tablet  Take 81 mg by mouth daily.     DSS 100 MG Caps  Take 100 mg by mouth 2 (two) times  daily.     metoprolol tartrate 25 MG tablet  Commonly known as:  LOPRESSOR  Take 25 mg by mouth daily. For HTN     oxyCODONE-acetaminophen 5-325 MG per tablet  Commonly known as:  PERCOCET/ROXICET  Take 1-2 tablets by mouth every 4 (four) hours as needed for severe pain. Hold for sedation     oxyCODONE-acetaminophen 5-325 MG per tablet  Commonly known as:  PERCOCET/ROXICET  Take 1-2 tablets by mouth every 4 (four) hours as needed for moderate pain.         SignedCristi Loron: [name] 04/06/2014, 10:44 AM

## 2014-04-06 NOTE — Progress Notes (Signed)
Discharge orders received. Pt for discharge home today. IV d/c'd. Back incision clean, dry, intact, open to air, brace on. Pt given discharge instructions with verbalized understanding. Family in room to assist with discharge. Staff brought pt downstairs via wheelchair.

## 2014-04-06 NOTE — Progress Notes (Signed)
[facility]. All questions answered.

## 2014-04-06 NOTE — Clinical Social Work Note (Signed)
[facility] Discharge packet complete and placed on pt's shadow chart. Pt's son, Jonny RuizJohn, updated at bedside. Pt to be transported via personal vehicle (with pt's son).  [facility] [phone]   Marcelline DeistEmily Zarin Hagmann, MSW, [facility] Licensed Clinical Social Worker [phone] and [phone] [phone]

## 2014-04-07 ENCOUNTER — Encounter: Payer: Self-pay | Admitting: Adult Health

## 2014-04-07 ENCOUNTER — Non-Acute Institutional Stay (SKILLED_NURSING_FACILITY): Payer: Medicare Other | Admitting: Adult Health

## 2014-04-07 ENCOUNTER — Other Ambulatory Visit: Payer: Self-pay | Admitting: *Deleted

## 2014-04-07 DIAGNOSIS — K5900 Constipation, unspecified: Secondary | ICD-10-CM

## 2014-04-07 DIAGNOSIS — I2510 Atherosclerotic heart disease of native coronary artery without angina pectoris: Secondary | ICD-10-CM | POA: Insufficient documentation

## 2014-04-07 DIAGNOSIS — Q762 Congenital spondylolisthesis: Secondary | ICD-10-CM

## 2014-04-07 DIAGNOSIS — M4316 Spondylolisthesis, lumbar region: Secondary | ICD-10-CM

## 2014-04-07 DIAGNOSIS — I10 Essential (primary) hypertension: Secondary | ICD-10-CM

## 2014-04-07 LAB — URINE CULTURE: Colony Count: >=100000

## 2014-04-07 MED ORDER — OXYCODONE-ACETAMINOPHEN 5-325 MG PO TABS: ORAL_TABLET | ORAL | Status: DC

## 2014-04-07 NOTE — Telephone Encounter (Signed)
[facility] 

## 2014-04-07 NOTE — Progress Notes (Signed)
Patient ID: [name] Fields, female   DOB: 11/18/1924, 80 y.o.   MRN: [id number]               PROGRESS NOTE  DATE: 04/07/2014  FACILITY: Nursing Home [facility]  LEVEL OF CARE: SNF (31)  Acute Visit  CHIEF COMPLAINT:  Follow-up Hospitalization  HISTORY OF PRESENT ILLNESS: This is an 80 year old female who [facility] with spondylolisthesis at L4-L5 status post decompression, instrumentation and fusion. She has been admitted for a short-term rehabilitation.  REASSESSMENT OF ONGOING PROBLEM(S):  HTN: Pt 's HTN remains stable.  Denies CP, sob, DOE, pedal edema, headaches, dizziness or visual disturbances.  No complications from the medications currently being used.  Last BP : 140/70  CAD: The angina has been stable. The patient denies dyspnea on exertion, orthopnea, pedal edema, palpitations and paroxysmal nocturnal dyspnea. No complications noted from the medication presently being used.  CONSTIPATION: Patient complains of constipation. No complications from the medications presently being used. Patient denies ongoing abdominal pain, nausea or vomiting.   PAST MEDICAL HISTORY : Reviewed.  No changes/see problem list  CURRENT MEDICATIONS: Reviewed per MAR/see medication list  REVIEW OF SYSTEMS:  GENERAL: no change in appetite, no fatigue, no weight changes, no fever, chills or weakness RESPIRATORY: no cough, SOB, DOE, wheezing, hemoptysis CARDIAC: no chest pain, edema or palpitations GI: no abdominal pain, diarrhea, heart burn, nausea or vomiting, +constipation  PHYSICAL EXAMINATION  GENERAL: no acute distress, normal body habitus SKIN:  Lower back midline incision is dry, no redness EYES: conjunctivae normal, sclerae normal, normal eye lids NECK: supple, trachea midline, no neck masses, no thyroid tenderness, no thyromegaly LYMPHATICS: no LAN in the neck, no supraclavicular  LAN RESPIRATORY: breathing is even & unlabored, BS CTAB CARDIAC: RRR, no murmur,no extra heart sounds, no edema GI: abdomen soft, normal BS, no masses, no tenderness, no hepatomegaly, no splenomegaly EXTREMITIES:  Able to move X4 extremities, has lumbar corset PSYCHIATRIC: the patient is alert & oriented to person, affect & behavior appropriate  LABS/RADIOLOGY: Labs reviewed: Basic Metabolic Panel:  Recent Labs  40/98/1106/25/15 0315 03/27/14 1625 04/04/14 0505  NA 138 141 142  K 4.2 4.7 3.8  CL 102 103 108  CO2 25 25 23   GLUCOSE 103* 99 117*  BUN 29* 24* 11  CREATININE 0.78 0.67 0.62  CALCIUM 9.4 9.2 8.7   Liver Function Tests:  Recent Labs  03/27/14 1625  AST 18  ALT 10  ALKPHOS 134*  BILITOT 0.5  PROT 7.1  ALBUMIN 3.6   CBC:  Recent Labs  08/27/13 1625 02/08/14 0650 03/27/14 1625 04/04/14 0505  WBC 9.9 10.2 9.7 10.0  NEUTROABS 7.2 7.0  --   --   HGB 12.3 13.0 13.8 10.8*  HCT 37.7 38.7 42.4 32.3*  MCV 87.3 84.5 88.3 86.8  PLT 296 279 319 268    EXAM: CHEST  2 VIEW   COMPARISON:  Portable chest 10/17/2007.   FINDINGS: The heart size and mediastinal contours are stable with atherosclerosis of the aorta and coronary arteries. There is stable biapical pleural parenchymal scarring. The lungs are otherwise clear. There is no pleural effusion or pneumothorax. No acute osseous findings are evident.   IMPRESSION: Stable chest.  No acute cardiopulmonary process. EXAM: LUMBAR SPINE - 1 VIEW   COMPARISON:  MRI dated 02/08/2014   FINDINGS: There are instruments on the spinous processes of L4 and L5.   IMPRESSION:  Instruments at L4 and L5. EXAM: LUMBAR SPINE - 2-3 VIEW; DG C-ARM 61-120 MIN   COMPARISON:  MRI dated 02/08/2014   FINDINGS: AP and lateral C-arm images demonstrate the patient has undergone interbody and posterior fusion at L4-5. Hardware appears in good position. Slight residual spondylolisthesis at L4-5.   IMPRESSION: Fusion performed  at L4-5.   EXAM: LUMBAR SPINE - 2-3 VIEW; DG C-ARM 61-120 MIN   COMPARISON:  MRI dated 02/08/2014   FINDINGS: AP and lateral C-arm images demonstrate the patient has undergone interbody and posterior fusion at L4-5. Hardware appears in good position. Slight residual spondylolisthesis at L4-5.   IMPRESSION: Fusion performed at L4-5.   ASSESSMENT/PLAN:  Spondylolisthesis at L4-L5 status post decompression, instrumentation and fusion - for rehabilitation Hypertension - well controlled; continue Lopressor CAD - stable; continue aspirin Constipation - start senna S2 tabs by mouth twice a day x2 days then each bedtime    CPT CODE: 45409  Ella Bodo - NP [facility] [phone]

## 2014-04-11 ENCOUNTER — Non-Acute Institutional Stay (SKILLED_NURSING_FACILITY): Payer: Medicare Other | Admitting: Internal Medicine

## 2014-04-11 DIAGNOSIS — M48061 Spinal stenosis, lumbar region without neurogenic claudication: Secondary | ICD-10-CM

## 2014-04-11 DIAGNOSIS — I10 Essential (primary) hypertension: Secondary | ICD-10-CM

## 2014-04-11 DIAGNOSIS — I2510 Atherosclerotic heart disease of native coronary artery without angina pectoris: Secondary | ICD-10-CM

## 2014-04-11 DIAGNOSIS — K5900 Constipation, unspecified: Secondary | ICD-10-CM

## 2014-04-13 NOTE — Progress Notes (Signed)
HISTORY & PHYSICAL  DATE: 04/11/2014   FACILITY: [facility]  LEVEL OF CARE: SNF (31)  ALLERGIES:  Allergies  Allergen Reactions  . Other Hives    Paper Tape    CHIEF COMPLAINT:  Manage lumbar spinal stenosis, CAD and hypertension  HISTORY OF PRESENT ILLNESS: Patient is an 80 year old Caucasian female who is admitted to this facility for short-term rehabilitation after a recent hospitalization.  SPINAL STENOSIS: Patient's spinal stenoses remains stable. Patient denies numbness, tingling or weakness. She does complain of back pain. No complications reported from the medications currently being used. Patient had L4-5 spondylolisthesis, spinal stenosis, lumbago and lumbar radiculopathy. She underwent L4-5 decompression, instrumentation and fusion and tolerated the procedure well.  CAD: The angina has been stable. The patient denies dyspnea on exertion, orthopnea, pedal edema, palpitations and paroxysmal nocturnal dyspnea. No complications noted from the medication presently being used.  HTN: Pt 's HTN remains stable.  Denies CP, sob, DOE, pedal edema, headaches, dizziness or visual disturbances.  No complications from the medications currently being used.  Last BP : 130/65.  PAST MEDICAL HISTORY :  Past Medical History  Diagnosis Date  . Coronary artery disease     1st diag. stenosis of 70%  . Hyperlipidemia   . Hypertension   . Aortic stenosis     mild  . Chest pain   . Osteoporosis   . Spinal stenosis   . Shingles   . Arthritis   . Hx stress fracture     Lumbar 4-5    PAST SURGICAL HISTORY: Past Surgical History  Procedure Laterality Date  . Cardiac catheterization  10/18/2007    mild to moderate CAD,aggressive medical therapy  . Abdominal surgery      bowel obstruction  . Eye surgery      cataracts bil    SOCIAL HISTORY:  reports that she has never smoked. She does not have any smokeless tobacco history on file. She reports that she  does not drink alcohol or use illicit drugs.  FAMILY HISTORY: None  CURRENT MEDICATIONS: Reviewed per MAR/see medication list  REVIEW OF SYSTEMS:  See HPI otherwise 14 point ROS is negative.  PHYSICAL EXAMINATION  VS:  See VS section  GENERAL: no acute distress, normal body habitus EYES: conjunctivae normal, sclerae normal, normal eye lids MOUTH/THROAT: lips without lesions,no lesions in the mouth,tongue is without lesions,uvula elevates in midline NECK: supple, trachea midline, no neck masses, no thyroid tenderness, no thyromegaly LYMPHATICS: no LAN in the neck, no supraclavicular LAN RESPIRATORY: breathing is even & unlabored, BS CTAB CARDIAC: RRR, no murmur,no extra heart sounds, no edema GI:  ABDOMEN: abdomen soft, normal BS, no masses, no tenderness, has a brace on LIVER/SPLEEN: no hepatomegaly, no splenomegaly MUSCULOSKELETAL: HEAD: normal to inspection  EXTREMITIES: LEFT UPPER EXTREMITY: full range of motion, normal strength & tone RIGHT UPPER EXTREMITY:  full range of motion, normal strength & tone LEFT LOWER EXTREMITY: Moderate range of motion, normal strength & tone RIGHT LOWER EXTREMITY: Moderate range of motion, normal strength & tone PSYCHIATRIC: the patient is alert & oriented to person, affect & behavior appropriate  LABS/RADIOLOGY:  Labs reviewed: Basic Metabolic Panel:  Recent Labs  65/78/46 0315 03/27/14 1625 04/04/14 0505  NA 138 141 142  K 4.2 4.7 3.8  CL 102 103 108  CO2 GLUCOSE 103* 99 117*  BUN 29* 24* 11  CREATININE 0.78 0.67 0.62  CALCIUM 9.4 9.2  8.7   Liver Function Tests:  Recent Labs  03/27/14 1625  AST 18  ALT 10  ALKPHOS 134*  BILITOT 0.5  PROT 7.1  ALBUMIN 3.6   CBC:  Recent Labs  08/27/13 1625 02/08/14 0650 03/27/14 1625 04/04/14 0505  WBC 9.9 10.2 9.7 10.0  NEUTROABS 7.2 7.0  --   --   HGB 12.3 13.0 13.8 10.8*  HCT 37.7 38.7 42.4 32.3*  MCV 87.3 84.5 88.3 86.8  PLT 296 279 319 268    CHEST  2  VIEW   COMPARISON:  Portable chest 10/17/2007.   FINDINGS: The heart size and mediastinal contours are stable with atherosclerosis of the aorta and coronary arteries. There is stable biapical pleural parenchymal scarring. The lungs are otherwise clear. There is no pleural effusion or pneumothorax. No acute osseous findings are evident.   IMPRESSION: Stable chest.  No acute cardiopulmonary process. LUMBAR SPINE - 1 VIEW   COMPARISON:  MRI dated 02/08/2014   FINDINGS: There are instruments on the spinous processes of L4 and L5.   IMPRESSION: Instruments at L4 and L5. LUMBAR SPINE - 2-3 VIEW; DG C-ARM 61-120 MIN   COMPARISON:  MRI dated 02/08/2014   FINDINGS: AP and lateral C-arm images demonstrate the patient has undergone interbody and posterior fusion at L4-5. Hardware appears in good position. Slight residual spondylolisthesis at L4-5.   IMPRESSION: Fusion performed at L4-5.   LUMBAR SPINE - 2-3 VIEW; DG C-ARM 61-120 MIN   COMPARISON:  MRI dated 02/08/2014   FINDINGS: AP and lateral C-arm images demonstrate the patient has undergone interbody and posterior fusion at L4-5. Hardware appears in good position. Slight residual spondylolisthesis at L4-5.   IMPRESSION: Fusion performed at L4-5.    ASSESSMENT/PLAN:  Lumbar spinal stenosis-status post decompression and fusion. Continue rehabilitation. CAD-stable Hypertension-well-controlled Constipation-senna was started Acute blood loss anemia-stable  I have reviewed patient's medical records received at admission/from hospitalization.  CPT CODE: 69629  Angela Cox, MD [facility] [phone]

## 2014-04-25 ENCOUNTER — Encounter: Payer: Self-pay | Admitting: Adult Health

## 2014-04-25 ENCOUNTER — Non-Acute Institutional Stay (SKILLED_NURSING_FACILITY): Payer: Medicare Other | Admitting: Adult Health

## 2014-04-25 DIAGNOSIS — F32A Depression, unspecified: Secondary | ICD-10-CM

## 2014-04-25 DIAGNOSIS — F3289 Other specified depressive episodes: Secondary | ICD-10-CM

## 2014-04-25 DIAGNOSIS — F329 Major depressive disorder, single episode, unspecified: Secondary | ICD-10-CM

## 2014-04-25 NOTE — Progress Notes (Signed)
Patient ID: [name] Fields, female   DOB: 1925-03-16, 80 y.o.   MRN: [id number]             PROGRESS NOTE  DATE:      04/25/14  FACILITY: Nursing Home Location: [facility]  LEVEL OF CARE: SNF (31)  Acute Visit  CHIEF COMPLAINT:  Manage Depression  HISTORY OF PRESENT ILLNESS: This is an 80 year old female who complains of feeling sad. Son is requesting if patient can have something for depression.  PAST MEDICAL HISTORY : Reviewed.  No changes/see problem list  CURRENT MEDICATIONS: Reviewed per MAR/see medication list  REVIEW OF SYSTEMS:  GENERAL: no change in appetite, no fatigue, no weight changes, no fever, chills or weakness RESPIRATORY: no cough, SOB, DOE, wheezing, hemoptysis CARDIAC: no chest pain, edema or palpitations GI: no abdominal pain, diarrhea, heart burn, nausea or vomiting  PHYSICAL EXAMINATION  GENERAL: no acute distress, normal body habitus EYES: conjunctivae normal, sclerae normal, normal eye lids NECK: supple, trachea midline, no neck masses, no thyroid tenderness, no thyromegaly LYMPHATICS: no LAN in the neck, no supraclavicular LAN RESPIRATORY: breathing is even & unlabored, BS CTAB CARDIAC: RRR, no murmur,no extra heart sounds, no edema GI: abdomen soft, normal BS, no masses, no tenderness, no hepatomegaly, no splenomegaly EXTREMITIES:  Able to move X4 extremities, has lumbar corset PSYCHIATRIC: the patient is alert & oriented to person, affect & behavior appropriate  LABS/RADIOLOGY: Labs reviewed: Basic Metabolic Panel:  Recent Labs  40/98/11 0315 03/27/14 1625 04/04/14 0505  NA 138 141 142  K 4.2 4.7 3.8  CL 102 103 108  CO2 GLUCOSE 103* 99 117*  BUN 29* 24* 11  CREATININE 0.78 0.67 0.62  CALCIUM 9.4 9.2 8.7   Liver Function Tests:  Recent Labs  03/27/14 1625  AST 18  ALT 10  ALKPHOS 134*  BILITOT 0.5  PROT 7.1  ALBUMIN 3.6   CBC:  Recent Labs  08/27/13 1625 02/08/14 0650 03/27/14 1625  04/04/14 0505  WBC 9.9 10.2 9.7 10.0  NEUTROABS 7.2 7.0  --   --   HGB 12.3 13.0 13.8 10.8*  HCT 37.7 38.7 42.4 32.3*  MCV 87.3 84.5 88.3 86.8  PLT 296 279 319 268    EXAM: CHEST  2 VIEW   COMPARISON:  Portable chest 10/17/2007.   FINDINGS: The heart size and mediastinal contours are stable with atherosclerosis of the aorta and coronary arteries. There is stable biapical pleural parenchymal scarring. The lungs are otherwise clear. There is no pleural effusion or pneumothorax. No acute osseous findings are evident.   IMPRESSION: Stable chest.  No acute cardiopulmonary process. EXAM: LUMBAR SPINE - 1 VIEW   COMPARISON:  MRI dated 02/08/2014   FINDINGS: There are instruments on the spinous processes of L4 and L5.   IMPRESSION: Instruments at L4 and L5. EXAM: LUMBAR SPINE - 2-3 VIEW; DG C-ARM 61-120 MIN   COMPARISON:  MRI dated 02/08/2014   FINDINGS: AP and lateral C-arm images demonstrate the patient has undergone interbody and posterior fusion at L4-5. Hardware appears in good position. Slight residual spondylolisthesis at L4-5.   IMPRESSION: Fusion performed at L4-5.   EXAM: LUMBAR SPINE - 2-3 VIEW; DG C-ARM 61-120 MIN   COMPARISON:  MRI dated 02/08/2014   FINDINGS: AP and lateral C-arm images demonstrate the patient has undergone interbody and posterior fusion at L4-5. Hardware appears in good position. Slight residual spondylolisthesis at L4-5.   IMPRESSION: Fusion performed at L4-5.  ASSESSMENT/PLAN:  Depression - start Lexapro 10 mg 1 PO Q D   CPT CODE: 40981   Ella Bodo - NP [facility] [phone]

## 2014-05-02 ENCOUNTER — Encounter: Payer: Self-pay | Admitting: Adult Health

## 2014-05-02 ENCOUNTER — Non-Acute Institutional Stay (SKILLED_NURSING_FACILITY): Payer: Medicare Other | Admitting: Adult Health

## 2014-05-02 DIAGNOSIS — Q762 Congenital spondylolisthesis: Secondary | ICD-10-CM

## 2014-05-02 DIAGNOSIS — F3289 Other specified depressive episodes: Secondary | ICD-10-CM

## 2014-05-02 DIAGNOSIS — K5900 Constipation, unspecified: Secondary | ICD-10-CM

## 2014-05-02 DIAGNOSIS — G4700 Insomnia, unspecified: Secondary | ICD-10-CM

## 2014-05-02 DIAGNOSIS — I10 Essential (primary) hypertension: Secondary | ICD-10-CM

## 2014-05-02 DIAGNOSIS — M4316 Spondylolisthesis, lumbar region: Secondary | ICD-10-CM

## 2014-05-02 DIAGNOSIS — F32A Depression, unspecified: Secondary | ICD-10-CM

## 2014-05-02 DIAGNOSIS — I2510 Atherosclerotic heart disease of native coronary artery without angina pectoris: Secondary | ICD-10-CM

## 2014-05-02 DIAGNOSIS — F329 Major depressive disorder, single episode, unspecified: Secondary | ICD-10-CM

## 2014-05-02 NOTE — Progress Notes (Signed)
Patient ID: [name] Fields, female   DOB: 09/29/1924, 80 y.o.   MRN: [id number]              PROGRESS NOTE  DATE:   05/02/14  FACILITY: Nursing Home Location: [facility]  LEVEL OF CARE: SNF (31)  Acute Visit  CHIEF COMPLAINT:  Follow-up Hospitalization  HISTORY OF PRESENT ILLNESS: This is an 80 year old female who is for discharge home with home health PT, OT, nursing and home health aide. She has been admitted to [facility] on 04/06/14 from [facility] with spondylolisthesis at L4-L5 status post decompression, instrumentation and fusion. Patient was admitted to this facility for short-term rehabilitation after the patient's recent hospitalization.  Patient has completed SNF rehabilitation and therapy has cleared the patient for discharge.  REASSESSMENT OF ONGOING PROBLEM(S):  HTN: Pt 's HTN remains stable.  Denies CP, sob, DOE, pedal edema, headaches, dizziness or visual disturbances.  No complications from the medications currently being used.  Last BP : 112/60  DEPRESSION: The depression remains stable. Patient denies ongoing feelings of sadness, insomnia, anedhonia or lack of appetite. No complications reported from the medications currently being used. Staff do not report behavioral problems.  CAD: The angina has been stable. The patient denies dyspnea on exertion, orthopnea, pedal edema, palpitations and paroxysmal nocturnal dyspnea. No complications noted from the medication presently being used.  PAST MEDICAL HISTORY : Reviewed.  No changes/see problem list  CURRENT MEDICATIONS: Reviewed per MAR/see medication list  REVIEW OF SYSTEMS:  GENERAL: no change in appetite, no fatigue, no weight changes, no fever, chills or weakness RESPIRATORY: no cough, SOB, DOE, wheezing, hemoptysis CARDIAC: no chest pain, edema or palpitations GI: no abdominal pain, diarrhea, heart burn, nausea or vomiting  PHYSICAL EXAMINATION  GENERAL: no acute distress,  normal body habitus SKIN:  Lower back midline incision is dry, no redness EYES: conjunctivae normal, sclerae normal, normal eye lids NECK: supple, trachea midline, no neck masses, no thyroid tenderness, no thyromegaly RESPIRATORY: breathing is even & unlabored, BS CTAB CARDIAC: RRR, no murmur,no extra heart sounds, no edema GI: abdomen soft, normal BS, no masses, no tenderness, no hepatomegaly, no splenomegaly EXTREMITIES:  Able to move X4 extremities, has lumbar corset; ambulates with walker PSYCHIATRIC: the patient is alert & oriented to person, affect & behavior appropriate  LABS/RADIOLOGY: 04/14/14  left lower extremity venous Doppler shows negative for DVT Labs reviewed: Basic Metabolic Panel:  Recent Labs  40/98/11 0315 03/27/14 1625 04/04/14 0505  NA 138 141 142  K 4.2 4.7 3.8  CL 102 103 108  CO2 GLUCOSE 103* 99 117*  BUN 29* 24* 11  CREATININE 0.78 0.67 0.62  CALCIUM 9.4 9.2 8.7   Liver Function Tests:  Recent Labs  03/27/14 1625  AST 18  ALT 10  ALKPHOS 134*  BILITOT 0.5  PROT 7.1  ALBUMIN 3.6   CBC:  Recent Labs  08/27/13 1625 02/08/14 0650 03/27/14 1625 04/04/14 0505  WBC 9.9 10.2 9.7 10.0  NEUTROABS 7.2 7.0  --   --   HGB 12.3 13.0 13.8 10.8*  HCT 37.7 38.7 42.4 32.3*  MCV 87.3 84.5 88.3 86.8  PLT 296 279 319 268    EXAM: CHEST  2 VIEW   COMPARISON:  Portable chest 10/17/2007.   FINDINGS: The heart size and mediastinal contours are stable with atherosclerosis of the aorta and coronary arteries. There is stable biapical pleural parenchymal scarring. The lungs are otherwise clear. There is no  pleural effusion or pneumothorax. No acute osseous findings are evident.   IMPRESSION: Stable chest.  No acute cardiopulmonary process. EXAM: LUMBAR SPINE - 1 VIEW   COMPARISON:  MRI dated 02/08/2014   FINDINGS: There are instruments on the spinous processes of L4 and L5.   IMPRESSION: Instruments at L4 and L5. EXAM: LUMBAR  SPINE - 2-3 VIEW; DG C-ARM 61-120 MIN   COMPARISON:  MRI dated 02/08/2014   FINDINGS: AP and lateral C-arm images demonstrate the patient has undergone interbody and posterior fusion at L4-5. Hardware appears in good position. Slight residual spondylolisthesis at L4-5.   IMPRESSION: Fusion performed at L4-5.   EXAM: LUMBAR SPINE - 2-3 VIEW; DG C-ARM 61-120 MIN   COMPARISON:  MRI dated 02/08/2014   FINDINGS: AP and lateral C-arm images demonstrate the patient has undergone interbody and posterior fusion at L4-5. Hardware appears in good position. Slight residual spondylolisthesis at L4-5.   IMPRESSION: Fusion performed at L4-5.   ASSESSMENT/PLAN:  Spondylolisthesis at L4-L5 status post decompression, instrumentation and fusion - for home health PT, OT, nursing and home health aide Hypertension - well controlled; continue Lopressor CAD - stable; continue aspirin Constipation - stable; continue senna S. and Colace Insomnia - continue melatonin Depression - recently started on Lexapro  I have filled out patient's discharge paperwork and written prescriptions.  Patient will receive home health PT, OT, Nursing and home health aide.  Total discharge time: Less than 30 minutes  Discharge time involved coordination of the discharge process with Child psychotherapist, nursing staff and therapy department. Medical justification for home health services verified.   CPT CODE: 96045  Ella Bodo - NP [facility] [phone]

## 2015-01-02 ENCOUNTER — Encounter (HOSPITAL_COMMUNITY): Payer: Self-pay

## 2015-01-02 ENCOUNTER — Emergency Department (HOSPITAL_COMMUNITY)
Admission: EM | Admit: 2015-01-02 | Discharge: 2015-01-02 | Disposition: A | Discharge status: Home / Self Care | Payer: Medicare Other | Attending: Emergency Medicine | Admitting: Emergency Medicine

## 2015-01-02 ENCOUNTER — Emergency Department (HOSPITAL_COMMUNITY): Payer: Medicare Other

## 2015-01-02 DIAGNOSIS — R1310 Dysphagia, unspecified: Secondary | ICD-10-CM | POA: Insufficient documentation

## 2015-01-02 DIAGNOSIS — Z87312 Personal history of (healed) stress fracture: Secondary | ICD-10-CM | POA: Insufficient documentation

## 2015-01-02 DIAGNOSIS — I2510 Atherosclerotic heart disease of native coronary artery without angina pectoris: Secondary | ICD-10-CM | POA: Diagnosis not present

## 2015-01-02 DIAGNOSIS — Z7982 Long term (current) use of aspirin: Secondary | ICD-10-CM | POA: Diagnosis not present

## 2015-01-02 DIAGNOSIS — M1990 Unspecified osteoarthritis, unspecified site: Secondary | ICD-10-CM | POA: Diagnosis not present

## 2015-01-02 DIAGNOSIS — I10 Essential (primary) hypertension: Secondary | ICD-10-CM | POA: Insufficient documentation

## 2015-01-02 DIAGNOSIS — Z792 Long term (current) use of antibiotics: Secondary | ICD-10-CM | POA: Diagnosis not present

## 2015-01-02 DIAGNOSIS — Z8619 Personal history of other infectious and parasitic diseases: Secondary | ICD-10-CM | POA: Diagnosis not present

## 2015-01-02 DIAGNOSIS — Z8639 Personal history of other endocrine, nutritional and metabolic disease: Secondary | ICD-10-CM | POA: Diagnosis not present

## 2015-01-02 DIAGNOSIS — Z87440 Personal history of urinary (tract) infections: Secondary | ICD-10-CM | POA: Insufficient documentation

## 2015-01-02 DIAGNOSIS — Z9889 Other specified postprocedural states: Secondary | ICD-10-CM | POA: Diagnosis not present

## 2015-01-02 DIAGNOSIS — Z79899 Other long term (current) drug therapy: Secondary | ICD-10-CM | POA: Diagnosis not present

## 2015-01-02 HISTORY — DX: Urinary tract infection, site not specified: N39.0

## 2015-01-02 NOTE — Discharge Instructions (Signed)

## 2015-01-02 NOTE — ED Provider Notes (Signed)
CSN: [id number]     Arrival date & time 01/02/15  1740 History   First MD Initiated Contact with Patient 01/02/15 1749     Chief Complaint  Patient presents with  . Dysphagia    chronic      HPI Patient presents emergency department complaining of a choking sensation today when swallowing.  She has a long-standing history of intermittent dysphagia.  She had a barium swallow in 2014 which showed dysmotility issues without stricture or mass.  She states she done well and hadn't had symptoms approximate 7 months and now she's had 3 or 4 episodes in the past couple weeks.  This is abnormal for her.  She reports that she swallowed and felt the abnormal sensation in her mid esophagus and then vomited material back up.  She feels much better at this time.  She has absolutely no complaints at this time.   Past Medical History  Diagnosis Date  . Coronary artery disease     1st diag. stenosis of 70%  . Hyperlipidemia   . Hypertension   . Aortic stenosis     mild  . Chest pain   . Osteoporosis   . Spinal stenosis   . Shingles   . Arthritis   . Hx stress fracture     Lumbar 4-5  . UTI (lower urinary tract infection)    Past Surgical History  Procedure Laterality Date  . Cardiac catheterization  10/18/2007    mild to moderate CAD,aggressive medical therapy  . Abdominal surgery      bowel obstruction  . Eye surgery      cataracts bil   No family history on file. History  Substance Use Topics  . Smoking status: Never Smoker   . Smokeless tobacco: Not on file  . Alcohol Use: No   OB History    No data available     Review of Systems  All other systems reviewed and are negative.     Allergies  Other  Home Medications   Prior to Admission medications   Medication Sig Start Date End Date Taking? Authorizing Provider  aspirin EC 81 MG tablet Take 81 mg by mouth daily.   Yes Historical Provider, MD  ciprofloxacin (CIPRO) 500 MG tablet Take 500 mg by mouth 2 (two) times  daily. 12/29/14  Yes Historical Provider, MD  docusate sodium 100 MG CAPS Take 100 mg by mouth 2 (two) times daily. 04/06/14  Yes Tressie StalkerJeffrey Jenkins, MD  metoprolol tartrate (LOPRESSOR) 25 MG tablet Take 25 mg by mouth daily. For HTN   Yes Historical Provider, MD  donepezil (ARICEPT) 10 MG tablet Take 1 tablet by mouth at bedtime. 11/21/14   Historical Provider, MD  oxyCODONE-acetaminophen (PERCOCET/ROXICET) 5-325 MG per tablet Take 1-2 tablets by mouth every 4 (four) hours as needed for moderate pain. Patient not taking: Reported on 01/02/2015 04/06/14   Tressie StalkerJeffrey Jenkins, MD  oxyCODONE-acetaminophen (PERCOCET/ROXICET) 5-325 MG per tablet Take one tablet by mouth every 4 hours as needed for mild to moderate pain; Take two tablets by mouth every 4 hours as needed for moderate to severe pain Patient not taking: Reported on 01/02/2015 04/07/14   Tiffany L Reed, DO  sulfamethoxazole-trimethoprim (BACTRIM DS) 800-160 MG per tablet Take 1 tablet by mouth every 12 (twelve) hours. Patient not taking: Reported on 01/02/2015 04/06/14   Tressie StalkerJeffrey Jenkins, MD   BP 160/80 mmHg  Pulse 81  Temp(Src) 98.2 F (36.8 C) (Oral)  Resp 18  Ht 5' (1.524 m)  Wt 113 lb (51.256 kg)  BMI 22.07 kg/m2  SpO2 100% Physical Exam  Constitutional: She is oriented to person, place, and time. She appears well-developed and well-nourished. No distress.  HENT:  Head: Normocephalic and atraumatic.  Eyes: EOM are normal.  Neck: Normal range of motion.  Cardiovascular: Normal rate, regular rhythm and normal heart sounds.   Pulmonary/Chest: Effort normal and breath sounds normal.  Abdominal: Soft. She exhibits no distension. There is no tenderness.  Musculoskeletal: Normal range of motion.  Neurological: She is alert and oriented to person, place, and time.  Skin: Skin is warm and dry.  Psychiatric: She has a normal mood and affect. Judgment normal.  Nursing note and vitals reviewed.   ED Course  Procedures (including critical care  time) Labs Review Labs Reviewed - No data to display  Imaging Review Dg Chest 2 View  01/02/2015   CLINICAL DATA:  Dysphagia and choking.  EXAM: CHEST - 2 VIEW  COMPARISON:  03/27/2001  FINDINGS: The heart size and mediastinal contours are within normal limits. Stable pulmonary scarring at the apices and left lung base. There is no evidence of pulmonary edema, consolidation, pneumothorax, nodule or pleural fluid. The spine shows stable scoliosis  IMPRESSION: No active disease.   Electronically Signed   By: Irish LackGlenn  Yamagata M.D.   On: 01/02/2015 18:57  I personally reviewed the imaging tests through PACS system I reviewed available ER/hospitalization records through the EMR    EKG Interpretation None      MDM   Final diagnoses:  Dysphagia    Chest x-ray is normal.  No indication for additional workup.  Patient is asymptomatic at this time.  She can keep fluids down.  Patient understands the importance of follow-up with a gastroenterologist.  She may benefit from repeat barium swallow just to make sure that this remains esophageal dysmotility as the cause of her symptoms and that she has not developed a new mass or stricture the meantime.    Azalia BilisKevin Nettye Flegal, MD 01/02/15 [phone]

## 2015-01-02 NOTE — ED Notes (Signed)
Per [facility]- called to residence to pt "choking". HX of dysphagia. Upon arrival pt coughing thick clear sputum. ABC intact. Presentation normal per family. Pt resumed back to baseline 30 minutes after event began. Additional event occurred Friday and Sunday which she was not called out for either of those events. Pt with no other complaints

## 2015-01-02 NOTE — ED Notes (Signed)
Bed: [id number] Expected date:  Expected time:  Means of arrival:  Comments: EMS- elderly, dysphagia, Hx of same

## 2015-01-08 ENCOUNTER — Other Ambulatory Visit: Payer: Self-pay | Admitting: Physician Assistant

## 2015-01-08 DIAGNOSIS — R1310 Dysphagia, unspecified: Secondary | ICD-10-CM

## 2015-01-10 ENCOUNTER — Ambulatory Visit
Admission: RE | Admit: 2015-01-10 | Discharge: 2015-01-10 | Disposition: A | Discharge status: Home / Self Care | Payer: Medicare Other | Source: Ambulatory Visit | Attending: Physician Assistant | Admitting: Physician Assistant

## 2015-01-10 DIAGNOSIS — R1310 Dysphagia, unspecified: Secondary | ICD-10-CM

## 2015-02-12 ENCOUNTER — Other Ambulatory Visit: Payer: Self-pay

## 2015-02-20 ENCOUNTER — Other Ambulatory Visit: Payer: Self-pay | Admitting: Geriatric Medicine

## 2015-02-20 DIAGNOSIS — R269 Unspecified abnormalities of gait and mobility: Secondary | ICD-10-CM

## 2015-03-04 ENCOUNTER — Ambulatory Visit
Admission: RE | Admit: 2015-03-04 | Discharge: 2015-03-04 | Disposition: A | Discharge status: Home / Self Care | Payer: Medicare Other | Source: Ambulatory Visit | Attending: Geriatric Medicine | Admitting: Geriatric Medicine

## 2015-03-04 DIAGNOSIS — R269 Unspecified abnormalities of gait and mobility: Secondary | ICD-10-CM

## 2015-03-13 ENCOUNTER — Ambulatory Visit: Payer: Medicare Other | Attending: Geriatric Medicine | Admitting: Physical Therapy

## 2015-03-13 DIAGNOSIS — H8112 Benign paroxysmal vertigo, left ear: Secondary | ICD-10-CM | POA: Insufficient documentation

## 2015-03-14 ENCOUNTER — Encounter: Payer: Self-pay | Admitting: Physical Therapy

## 2015-03-14 NOTE — Patient Instructions (Signed)
Benign Positional Vertigo Vertigo means you feel like you or your surroundings are moving when they are not. Benign positional vertigo is the most common form of vertigo. Benign means that the cause of your condition is not serious. Benign positional vertigo is more common in older adults. CAUSES  Benign positional vertigo is the result of an upset in the labyrinth system. This is an area in the middle ear that helps control your balance. This may be caused by a viral infection, head injury, or repetitive motion. However, often no specific cause is found. SYMPTOMS  Symptoms of benign positional vertigo occur when you move your head or eyes in different directions. Some of the symptoms may include:  Loss of balance and falls.  Vomiting.  Blurred vision.  Dizziness.  Nausea.  Involuntary eye movements (nystagmus). DIAGNOSIS  Benign positional vertigo is usually diagnosed by physical exam. If the specific cause of your benign positional vertigo is unknown, your caregiver may perform imaging tests, such as magnetic resonance imaging (MRI) or computed tomography (CT). TREATMENT  Your caregiver may recommend movements or procedures to correct the benign positional vertigo. Medicines such as meclizine, benzodiazepines, and medicines for nausea may be used to treat your symptoms. In rare cases, if your symptoms are caused by certain conditions that affect the inner ear, you may need surgery. HOME CARE INSTRUCTIONS   Follow your caregiver's instructions.  Move slowly. Do not make sudden body or head movements.  Avoid driving.  Avoid operating heavy machinery.  Avoid performing any tasks that would be dangerous to you or others during a vertigo episode.  Drink enough fluids to keep your urine clear or pale yellow. SEEK IMMEDIATE MEDICAL CARE IF:   You develop problems with walking, weakness, numbness, or using your arms, hands, or legs.  You have difficulty speaking.  You develop  severe headaches.  Your nausea or vomiting continues or gets worse.  You develop visual changes.  Your family or friends notice any behavioral changes.  Your condition gets worse.  You have a fever.  You develop a stiff neck or sensitivity to light. MAKE SURE YOU:   Understand these instructions.  Will watch your condition.  Will get help right away if you are not doing well or get worse. Document Released: 05/12/2006 Document Revised: 10/27/2011 Document Reviewed: 04/24/2011 ExitCare Patient Information 2015 [employer]. This information is not intended to replace advice given to you by your health care provider. Make sure you discuss any questions you have with your health care provider.    

## 2015-03-14 NOTE — Therapy (Signed)
[facility] [address] [place], [place], [address] Phone: [phone]   Fax:  [phone]  Physical Therapy Evaluation  Patient Details  Name: [name] Fields MRN: [id number] Date of Birth: 1925/04/13 Referring Provider:  Merlene Laughter, MD  Encounter Date: 03/13/2015      PT End of Session - 03/14/15 1454    Visit Number 1  G1   Number of Visits 2   Date for PT Re-Evaluation 04/12/15   Authorization Type [employer] Medicare   PT Start Time 1020   PT Stop Time 1103   PT Time Calculation (min) 43 min      Past Medical History  Diagnosis Date  . Coronary artery disease     1st diag. stenosis of 70%  . Hyperlipidemia   . Hypertension   . Aortic stenosis     mild  . Chest pain   . Osteoporosis   . Spinal stenosis   . Shingles   . Arthritis   . Hx stress fracture     Lumbar 4-5  . UTI (lower urinary tract infection)     Past Surgical History  Procedure Laterality Date  . Cardiac catheterization  10/18/2007    mild to moderate CAD,aggressive medical therapy  . Abdominal surgery      bowel obstruction  . Eye surgery      cataracts bil    There were no vitals filed for this visit.  Visit Diagnosis:  BPPV (benign paroxysmal positional vertigo), left - Plan: PT plan of care cert/re-cert      Subjective Assessment - 03/14/15 1438    Subjective Pt reports vertigo started about 1-2 months ago; has used RW intermittently about a year; states vertigo is usually provoked with turning; pt states balance has been affected by vertigo; pt denies nausea and vomiting due to vertigo                                                                                                                                      Patient is accompained by: Family member  daughter - Erskine Squibb   Pertinent History CAD:  osteoporosis; spinal stenosis; h/o L4 stress fracture due to fall sustained last summer   Patient Stated Goals resolve the  vertigo   Currently in Pain? No/denies            [facility] PT Assessment - 03/14/15 0001    Assessment   Medical Diagnosis BPPV   Onset Date/Surgical Date --  about 6 weeks ago (early June 2016)   Prior Therapy pt had home health after L4 fracture due to fall last summer   Precautions   Precautions Fall   Balance Screen   Has the patient fallen in the past 6 months No   Has the patient had a decrease in activity level because of a fear of falling?  Yes   Is the patient reluctant to  leave their home because of a fear of falling?  No   Home Environment   Living Environment Private residence  has assistance outside the home (with community activities)   Type of Home House   Home Access Ramped entrance   Home Layout One level   Prior Function   Level of Independence Needs assistance with homemaking;Requires assistive device for independence   Observation/Other Assessments   Focus on Therapeutic Outcomes (FOTO)  staff did not capture            Vestibular Assessment - 03/14/15 0001    Vestibular Assessment   General Observation pt is an 81 year old lady with c/o vertigo with quick movements; pt is using a RW for assist. with amb. due to unsteady gait   Symptom Behavior   Type of Dizziness Spinning   Frequency of Dizziness varies   Duration of Dizziness seconds - depends on position   Aggravating Factors Rolling to left;Activity in general   Occulomotor Exam   Occulomotor Alignment Normal   Spontaneous Absent   Positional Testing   Dix-Hallpike Dix-Hallpike Left;Dix-Hallpike Right   Sidelying Test Sidelying Left;Sidelying Right   Dix-Hallpike Right   Dix-Hallpike Right Duration none   Dix-Hallpike Right Symptoms No nystagmus   Dix-Hallpike Left   Dix-Hallpike Left Duration approx. 10 secs   Dix-Hallpike Left Symptoms Upbeat, left rotatory nystagmus   Sidelying Left   Sidelying Left Duration 8 secs   Sidelying Left Symptoms Upbeat, left rotatory nystagmus       Canalith repositioning -- Epley Maneuver performed for L BPPV x 3 reps; pt had no nystagmus and no c/o vertigo  On 3rd rep but did report some mild light-headedness with sidelying to sitting position during last transitional movement  of maneuver -- appeared to be related to BP change rather than true vertigo; L BPPV appeared to be resolved after 3rd rep; Pt. Then used RW to amb. 100' for gait assessment - tennis balls were placed on her RW - she stated she felt more steady  with walking after Epley maneuver                 PT Education - 03/14/15 1452    Education provided Yes   Education Details Explained etiology of BPPV to pt and daughter; also discussed recommendation of cont. PT to address balance deficits - OP PT recommended but pt requests home health due to transportation difficulties   Person(s) Educated Patient;Child(ren)   Methods Explanation;Handout   Comprehension Verbalized understanding             PT Long Term Goals - 03/14/15 1502    PT LONG TERM GOAL #1   Title Pt. will have a negative L Dix-Hallpike test to indicate that L BPPV has resolved.  04-12-15   Time 4   Period Weeks   Status New   PT LONG TERM GOAL #2   Title Pt. will report no vertigo with bed mobility.  04-12-15   Time 4   Period Weeks   Status New   PT LONG TERM GOAL #3   Title Independent in Brandt-Daroff exercises prn if BPPV not fully resolved.  04-12-15   Time 4   Period Weeks   Status New   PT LONG TERM GOAL #4   Title Verbalize plan to pursue home health PT to address balance deficits - pt states she is unable to come to OP on an ongoing basis due to lack of transportation.   Time 4  Period Weeks   Status New               Plan - 03/14/15 1455    Clinical Impression Statement Pt has signs and symptoms consistent with L BPPV canalithiasis; was treated with Epley's maneuver x 3 reps and appeared to be resolved on 3rd rep as no c/o vertigo reported in any position  during this final rep; no nystagmus noted during 3rd rep of Epley's   Pt will benefit from skilled therapeutic intervention in order to improve on the following deficits Difficulty walking;Dizziness;Decreased balance   Rehab Potential Good   PT Frequency 1x / week   PT Duration 2 weeks   PT Treatment/Interventions ADLs/Self Care Home Management;Canalith Repostioning;Functional mobility training;Gait training;Therapeutic activities;Therapeutic exercise;Balance training;Patient/family education;Vestibular   PT Next Visit Plan recheck L Dix-Hallpike ; Epley's maneuver prn   PT Home Exercise Plan recommend balance - pt is going to pursue home health PT due to lack of transportation    Recommended Other Services HH PT to address balance and gait deficits   Consulted and Agree with Plan of Care Patient;Family member/caregiver   Family Member Consulted daughter Myra Rude - 2015-04-12 1506    Functional Assessment Tool Used clinical judgment - (+) L Dix-Hallpike test indicative of L BPPV   Functional Limitation Other PT primary   Other PT Primary Current Status (O9629) At least 20 percent but less than 40 percent impaired, limited or restricted   Other PT Primary Goal Status (B2841) At least 1 percent but less than 20 percent impaired, limited or restricted       Problem List Patient Active Problem List   Diagnosis Date Noted  . Depression 04/25/2014  . CAD in native artery 04/07/2014  . Spondylolisthesis at L4-L5 level 04/03/2014  . Esophageal reflux 02/25/2014  . Unspecified constipation 02/24/2014  . Sacral fracture 02/10/2014  . Sacral fracture, closed 02/08/2014  . DJD (degenerative joint disease) 02/08/2014  . Hypertension   . Aortic stenosis   . Chest pain   . Osteoporosis   . Spinal stenosis     Hansini Clodfelter, Donavan Burnet, PT 03/14/2015, 3:12 PM  [facility] [address] [place], [place],  [address] Phone: [phone]   Fax:  [phone]

## 2015-03-21 ENCOUNTER — Encounter (HOSPITAL_COMMUNITY): Payer: Self-pay | Admitting: Emergency Medicine

## 2015-03-21 ENCOUNTER — Emergency Department (HOSPITAL_COMMUNITY): Payer: Medicare Other

## 2015-03-21 ENCOUNTER — Emergency Department (HOSPITAL_COMMUNITY)
Admission: EM | Admit: 2015-03-21 | Discharge: 2015-03-21 | Disposition: A | Discharge status: Home / Self Care | Payer: Medicare Other | Attending: Emergency Medicine | Admitting: Emergency Medicine

## 2015-03-21 DIAGNOSIS — S0081XA Abrasion of other part of head, initial encounter: Secondary | ICD-10-CM | POA: Insufficient documentation

## 2015-03-21 DIAGNOSIS — W19XXXA Unspecified fall, initial encounter: Secondary | ICD-10-CM

## 2015-03-21 DIAGNOSIS — Z9889 Other specified postprocedural states: Secondary | ICD-10-CM | POA: Diagnosis not present

## 2015-03-21 DIAGNOSIS — I2510 Atherosclerotic heart disease of native coronary artery without angina pectoris: Secondary | ICD-10-CM | POA: Diagnosis not present

## 2015-03-21 DIAGNOSIS — Z87312 Personal history of (healed) stress fracture: Secondary | ICD-10-CM | POA: Diagnosis not present

## 2015-03-21 DIAGNOSIS — I10 Essential (primary) hypertension: Secondary | ICD-10-CM | POA: Diagnosis not present

## 2015-03-21 DIAGNOSIS — W010XXA Fall on same level from slipping, tripping and stumbling without subsequent striking against object, initial encounter: Secondary | ICD-10-CM | POA: Diagnosis not present

## 2015-03-21 DIAGNOSIS — Y9289 Other specified places as the place of occurrence of the external cause: Secondary | ICD-10-CM | POA: Diagnosis not present

## 2015-03-21 DIAGNOSIS — Z8619 Personal history of other infectious and parasitic diseases: Secondary | ICD-10-CM | POA: Diagnosis not present

## 2015-03-21 DIAGNOSIS — Z79899 Other long term (current) drug therapy: Secondary | ICD-10-CM | POA: Diagnosis not present

## 2015-03-21 DIAGNOSIS — Y939 Activity, unspecified: Secondary | ICD-10-CM | POA: Insufficient documentation

## 2015-03-21 DIAGNOSIS — Y999 Unspecified external cause status: Secondary | ICD-10-CM | POA: Diagnosis not present

## 2015-03-21 DIAGNOSIS — M810 Age-related osteoporosis without current pathological fracture: Secondary | ICD-10-CM | POA: Insufficient documentation

## 2015-03-21 DIAGNOSIS — E785 Hyperlipidemia, unspecified: Secondary | ICD-10-CM | POA: Insufficient documentation

## 2015-03-21 DIAGNOSIS — M1990 Unspecified osteoarthritis, unspecified site: Secondary | ICD-10-CM | POA: Insufficient documentation

## 2015-03-21 DIAGNOSIS — N390 Urinary tract infection, site not specified: Secondary | ICD-10-CM | POA: Insufficient documentation

## 2015-03-21 DIAGNOSIS — S0990XA Unspecified injury of head, initial encounter: Secondary | ICD-10-CM | POA: Diagnosis present

## 2015-03-21 DIAGNOSIS — Z7982 Long term (current) use of aspirin: Secondary | ICD-10-CM | POA: Insufficient documentation

## 2015-03-21 DIAGNOSIS — Z87440 Personal history of urinary (tract) infections: Secondary | ICD-10-CM | POA: Diagnosis not present

## 2015-03-21 LAB — URINALYSIS, ROUTINE W REFLEX MICROSCOPIC
BILIRUBIN URINE: NEGATIVE
GLUCOSE, UA: NEGATIVE mg/dL
KETONES UR: NEGATIVE mg/dL
NITRITE: POSITIVE — AB
PH: 6 (ref 5.0–8.0)
PROTEIN: NEGATIVE mg/dL
SPECIFIC GRAVITY, URINE: 1.016 (ref 1.005–1.030)
UROBILINOGEN UA: 1 mg/dL (ref 0.0–1.0)

## 2015-03-21 LAB — URINE MICROSCOPIC-ADD ON

## 2015-03-21 MED ORDER — CEPHALEXIN 250 MG PO CAPS: 250.0000 mg | ORAL_CAPSULE | Freq: Four times a day (QID) | ORAL | Status: DC

## 2015-03-21 MED ORDER — CEPHALEXIN 250 MG PO CAPS
250.0000 mg | ORAL_CAPSULE | Freq: Once | ORAL | Status: AC
  Administered 2015-03-21: 250 mg via ORAL
  Filled 2015-03-21: qty 1

## 2015-03-21 NOTE — ED Notes (Addendum)
Per pt/family-fell backwards coming in from her porch-is suppose to be using walker due to her vertigo but did not have it with her prior to falling-might have UTI-no LOC-fell back on grass-neighbors were present and stated she was confused momentarily-patient does not remember incident

## 2015-03-21 NOTE — Progress Notes (Signed)
CSW met with patient at bedside. Daughter in law was present. Patient confirms that she presents to [facility] due to fall. Patient states that she is not sure what caused her to fall.   Patient informed CSW that she comes from home. Patient lives home alone in [place]. According to daughter in law, the patient has a Systems analyst Also, daughter in law states that the patient is currently receiving therapy at [facility] for Vertigo.   Patient states that she has fallen x3 within the past 6 months. Patient states that she completes her ADL's independently.   Patient states that her private duty nurse visits her Monday, Wednesday, and Friday during the hours of 8:00am and 3:00pm.  Patient and daughter in law state that they do not have any other questions at this time.  Willette Brace [phone] ED CSW 03/21/2015 9:42 PM

## 2015-03-21 NOTE — ED Notes (Signed)
Pt states she lost her balance and fell at home this afternoon, hitting the back of her head on the ground.  She states that it was painful at the time but that she is not currently in pain due to the fall.  She denies LOC, vision changes, and loss of sensation.  Neuro assessment WNL.

## 2015-03-21 NOTE — ED Provider Notes (Signed)
CSN: [id number]     Arrival date & time 03/21/15  1731 History   First MD Initiated Contact with Patient 03/21/15 1806     Chief Complaint  Patient presents with  . Fall     (Consider location/radiation/quality/duration/timing/severity/associated sxs/prior Treatment) HPI   [name] Fields is a 81 y.o. female who presents for evaluation of injuries from fall. She complains of headache after falling. Her daughter describes a mechanical fall, she was outside walking and tripped over a uneven place on the sidewalk. A neighbor got her shortly after the fall and found her alert but disoriented. They put her in a chair and she improved quickly. According to the daughter she is back to her baseline. She is currently under treatment for "vertigo" with manipulation, physical therapy, which has improved her dizziness. For the last 3 days she has had urinary frequency, in the evenings. She denies fever, chills, nausea, vomiting off, or chest pain. No injuries, arms and legs and fall. No known loss of consciousness. There are no other known modifying factors.   Past Medical History  Diagnosis Date  . Coronary artery disease     1st diag. stenosis of 70%  . Hyperlipidemia   . Hypertension   . Aortic stenosis     mild  . Chest pain   . Osteoporosis   . Spinal stenosis   . Shingles   . Arthritis   . Hx stress fracture     Lumbar 4-5  . UTI (lower urinary tract infection)    Past Surgical History  Procedure Laterality Date  . Cardiac catheterization  10/18/2007    mild to moderate CAD,aggressive medical therapy  . Abdominal surgery      bowel obstruction  . Eye surgery      cataracts bil   No family history on file. History  Substance Use Topics  . Smoking status: Never Smoker   . Smokeless tobacco: Not on file  . Alcohol Use: No   OB History    No data available     Review of Systems  All other systems reviewed and are negative.     Allergies  Other  Home Medications    Prior to Admission medications   Medication Sig Start Date End Date Taking? Authorizing Provider  amLODipine (NORVASC) 2.5 MG tablet Take 2.5 mg by mouth daily. 03/19/15  Yes Historical Provider, MD  Aspirin Buf,CaCarb-MgCarb-MgO, (BUFFERIN PO) Take 1 tablet by mouth at bedtime.   Yes Historical Provider, MD  aspirin EC 81 MG tablet Take 81 mg by mouth daily.   Yes Historical Provider, MD  escitalopram (LEXAPRO) 10 MG tablet Take 10 mg by mouth daily. 02/20/15  Yes Historical Provider, MD  lisinopril (PRINIVIL,ZESTRIL) 5 MG tablet Take 5 mg by mouth daily. 02/20/15  Yes Historical Provider, MD  metoprolol tartrate (LOPRESSOR) 25 MG tablet Take 25 mg by mouth daily. For HTN   Yes Historical Provider, MD  NEXIUM 24HR 20 MG capsule Take 20 mg by mouth daily. 03/12/15  Yes Historical Provider, MD  oxybutynin (DITROPAN-XL) 10 MG 24 hr tablet Take 10 mg by mouth daily. 03/14/15  Yes Historical Provider, MD  cephALEXin (KEFLEX) 250 MG capsule Take 1 capsule (250 mg total) by mouth 4 (four) times daily. 03/21/15   Mancel Bale, MD  docusate sodium 100 MG CAPS Take 100 mg by mouth 2 (two) times daily. Patient not taking: Reported on 03/21/2015 04/06/14   Tressie Stalker, MD  oxyCODONE-acetaminophen (PERCOCET/ROXICET) 5-325 MG per tablet Take 1-2  tablets by mouth every 4 (four) hours as needed for moderate pain. Patient not taking: Reported on 01/02/2015 04/06/14   Tressie Stalker, MD  oxyCODONE-acetaminophen (PERCOCET/ROXICET) 5-325 MG per tablet Take one tablet by mouth every 4 hours as needed for mild to moderate pain; Take two tablets by mouth every 4 hours as needed for moderate to severe pain Patient not taking: Reported on 01/02/2015 04/07/14   Tiffany L Reed, DO  sulfamethoxazole-trimethoprim (BACTRIM DS) 800-160 MG per tablet Take 1 tablet by mouth every 12 (twelve) hours. Patient not taking: Reported on 01/02/2015 04/06/14   Tressie Stalker, MD   BP 158/59 mmHg  Pulse 63  Temp(Src) 97.7 F (36.5 C) (Oral)   Resp 22  SpO2 99% Physical Exam  Constitutional: She appears well-developed. No distress.  Elderly, frail  HENT:  Head: Normocephalic and atraumatic.  Right Ear: External ear normal.  Left Ear: External ear normal.  Superficial abrasion, left medial eyebrow consistent with trauma from her glasses. No facial swelling. No midface instability. Normal TMJ motion.  Eyes: Conjunctivae and EOM are normal. Pupils are equal, round, and reactive to light.  Neck: Normal range of motion and phonation normal. Neck supple.  Cardiovascular: Normal rate, regular rhythm and normal heart sounds.   Pulmonary/Chest: Effort normal and breath sounds normal. She exhibits no bony tenderness.  Abdominal: Soft. There is no tenderness.  Musculoskeletal: Normal range of motion.  Neurological: She is alert. No cranial nerve deficit or sensory deficit. She exhibits normal muscle tone. Coordination normal.  No dysarthria and aphasia or nystagmus. No ataxia, while sitting  Skin: Skin is warm, dry and intact.  Psychiatric: She has a normal mood and affect. Her behavior is normal.  Nursing note and vitals reviewed.   ED Course  Procedures (including critical care time)  Medications  cephALEXin (KEFLEX) capsule 250 mg (not administered)    Patient Vitals for the past 24 hrs:  BP Temp Temp src Pulse Resp SpO2  03/21/15 1933 158/59 mmHg - - 63 22 99 %  03/21/15 1737 177/62 mmHg 97.7 F (36.5 C) Oral 69 16 100 %    8:59 PM Reevaluation with update and discussion. After initial assessment and treatment, an updated evaluation reveals she remains comfortable. Findings discussed with patient and daughter, all questions answered.Mancel Bale L    Labs Review Labs Reviewed  URINALYSIS, ROUTINE W REFLEX MICROSCOPIC (NOT AT [facility]) - Abnormal; Notable for the following:    APPearance CLOUDY (*)    Hgb urine dipstick SMALL (*)    Nitrite POSITIVE (*)    Leukocytes, UA MODERATE (*)    All other components within  normal limits  URINE MICROSCOPIC-ADD ON - Abnormal; Notable for the following:    Bacteria, UA MANY (*)    All other components within normal limits  URINE CULTURE    Imaging Review Ct Head Wo Contrast  03/21/2015   CLINICAL DATA:  Larey Seat backwards on grass.  No loss of consciousness.  EXAM: CT HEAD WITHOUT CONTRAST  TECHNIQUE: Contiguous axial images were obtained from the base of the skull through the vertex without intravenous contrast.  COMPARISON:  08/27/2013  FINDINGS: The ventricles and cisterns are within normal. There is mild prominence of the CSF spaces which are stable compatible with age related atrophy. There is moderate chronic ischemic microvascular disease. There is no mass, mass effect, shift of midline structures or acute hemorrhage. There is no evidence of acute infarction. Remaining bones and soft tissues are unchanged.  IMPRESSION: No acute intracranial findings.  Chronic ischemic microvascular disease.  Age related atrophy.   Electronically Signed   By: Elberta Fortis M.D.   On: 03/21/2015 19:51     EKG Interpretation   Date/Time:  Wednesday March 21 2015 18:00:11 EDT Ventricular Rate:  60 PR Interval:  161 QRS Duration: 89 QT Interval:  430 QTC Calculation: 430 R Axis:   -34 Text Interpretation:  Sinus rhythm Left axis deviation Low voltage,  precordial leads Consider anterior infarct Borderline ST depression,  lateral leads Baseline wander in lead(s) I II aVR aVL since last tracing  no significant change Confirmed by Effie Shy  MD, Mechele Collin ([id number]) on 03/21/2015  6:10:37 PM      MDM   Final diagnoses:  Fall, initial encounter  Head injury, initial encounter  Urinary tract infection without hematuria, site unspecified    Mechanical fall, with mild facial contusion, and head injury. Doubt serious bacterial infection, metabolic instability, fracture or impending vascular collapse.  Nursing Notes Reviewed/ Care Coordinated Applicable Imaging Reviewed Interpretation  of Laboratory Data incorporated into ED treatment  The patient appears reasonably screened and/or stabilized for discharge and I doubt any other medical condition or other [facility] requiring further screening, evaluation, or treatment in the ED at this time prior to discharge.  Plan: Home Medications- Keflex; Home Treatments- rest; return here if the recommended treatment, does not improve the symptoms; Recommended follow up- PCP 1 week   Mancel Bale, MD 03/21/15 2100

## 2015-03-23 LAB — URINE CULTURE: Special Requests: NORMAL

## 2015-03-25 ENCOUNTER — Telehealth (HOSPITAL_COMMUNITY): Payer: Self-pay

## 2015-03-25 NOTE — Telephone Encounter (Signed)
Post ED Visit - Positive Culture Follow-up  Culture report reviewed by antimicrobial stewardship pharmacist:  Wes Dulaney, Pharm.D., BCPS  Celedonio Miyamoto, Pharm.D., BCPS  Georgina Pillion, Pharm.D., BCPS  [place], [address].D., BCPS, AAHIVP  Estella Husk, Pharm.D., BCPS, AAHIVP  [place], [address].D., BCPS  Positive Urine culture>/= 100,000 colonies -> Klebsiella Pneumoniae Treated with Cephalexin, organism sensitive to the same and no further patient follow-up is required at this time.  Arvid Right 03/25/2015, 4:55 AM

## 2015-03-26 ENCOUNTER — Ambulatory Visit: Payer: Medicare Other | Admitting: Physical Therapy

## 2015-03-30 ENCOUNTER — Ambulatory Visit: Payer: Medicare Other | Attending: Geriatric Medicine | Admitting: Physical Therapy

## 2015-03-30 ENCOUNTER — Encounter: Payer: Self-pay | Admitting: Physical Therapy

## 2015-03-30 DIAGNOSIS — H8112 Benign paroxysmal vertigo, left ear: Secondary | ICD-10-CM | POA: Diagnosis present

## 2015-03-30 NOTE — Therapy (Signed)
[facility] [address] [place], [place], [address] Phone: [phone]   Fax:  [phone]  Physical Therapy Treatment  Patient Details  Name: [name] Fields MRN: [id number] Date of Birth: 10-Jun-1925 Referring Provider:  Merlene Laughter, MD  Encounter Date: 03/30/2015      PT End of Session - 03/30/15 2247    Visit Number 2  G2   Number of Visits 3   Date for PT Re-Evaluation 04/12/15   Authorization Type [employer] Medicare   Authorization Time Period 03-14-15 - 05-14-15   PT Start Time 1106   PT Stop Time 1150   PT Time Calculation (min) 44 min      Past Medical History  Diagnosis Date  . Coronary artery disease     1st diag. stenosis of 70%  . Hyperlipidemia   . Hypertension   . Aortic stenosis     mild  . Chest pain   . Osteoporosis   . Spinal stenosis   . Shingles   . Arthritis   . Hx stress fracture     Lumbar 4-5  . UTI (lower urinary tract infection)     Past Surgical History  Procedure Laterality Date  . Cardiac catheterization  10/18/2007    mild to moderate CAD,aggressive medical therapy  . Abdominal surgery      bowel obstruction  . Eye surgery      cataracts bil    There were no vitals filed for this visit.  Visit Diagnosis:  BPPV (benign paroxysmal positional vertigo), left      Subjective Assessment - 03/30/15 2243    Subjective Pt. states she has had much vertigo since fall sustained last Wed., 8-3 in which she fell back and hit her head; went to ED - had CAT scan done which was negative   Patient is accompained by: --  caregiver   Pertinent History CAD:  osteoporosis; spinal stenosis; h/o L4 stress fracture due to fall sustained last summer   Patient Stated Goals resolve the vertigo   Currently in Pain? No/denies                          Vestibular Treatment/Exercise - 03/30/15 0001    Vestibular Treatment/Exercise   Vestibular Treatment Provided Canalith  Repositioning   Canalith Repositioning Epley Manuever Left    EPLEY MANUEVER LEFT   Number of Reps  4   Overall Response  Improved Symptoms    RESPONSE DETAILS LEFT no nystagmus and no c/o vertigo reported on 4th rep of Epley's     NeuroRe-ed;  Positional testing - right sidelying test negative; rolling right and left with no c/o vertigo  positive L sidelying test with nystagmus and c/o vertigo  Self Care- discussed Brandt-Daroff exercises with pt and caregiver if not fully resolved; discussed Epley maneuver with caregiver  For instruction on correct positioning if needed to assist pt at home for self treament        PT Education - 03/30/15 2246    Education provided Yes   Education Details adjusted RW to correct height for pt.   Person(s) Educated Patient;Caregiver(s)   Methods Explanation;Demonstration   Comprehension Verbalized understanding;Returned demonstration             PT Long Term Goals - 03/14/15 1502    PT LONG TERM GOAL #1   Title Pt. will have a negative L Dix-Hallpike test to indicate that L BPPV has resolved.  04-12-15   Time 4   Period Weeks   Status New   PT LONG TERM GOAL #2   Title Pt. will report no vertigo with bed mobility.  04-12-15   Time 4   Period Weeks   Status New   PT LONG TERM GOAL #3   Title Independent in Brandt-Daroff exercises prn if BPPV not fully resolved.  04-12-15   Time 4   Period Weeks   Status New   PT LONG TERM GOAL #4   Title Verbalize plan to pursue home health PT to address balance deficits - pt states she is unable to come to OP on an ongoing basis due to lack of transportation.   Time 4   Period Weeks   Status New               Plan - 03/30/15 2248    Clinical Impression Statement Pt. appears to have caused re-occurrence of L BPPV with fall on 03-21-15 in which she hit her head; pt states she had not had any vertigo following treatment until this fall occurred; appears to have resolved with treatment of Epley  maneuver today                                                                                                                                                                                      Pt will benefit from skilled therapeutic intervention in order to improve on the following deficits Difficulty walking;Dizziness;Decreased balance   Rehab Potential Good   PT Frequency 1x / week   PT Duration 3 weeks   PT Treatment/Interventions ADLs/Self Care Home Management;Canalith Repostioning;Functional mobility training;Gait training;Therapeutic activities;Therapeutic exercise;Balance training;Patient/family education;Vestibular   PT Next Visit Plan recheck L Dix-Hallpike ; Epley's maneuver prn   PT Home Exercise Plan recommend balance - pt is going to pursue home health PT due to lack of transportation    Consulted and Agree with Plan of Care Patient;Other (Comment)  caregiver - Alana        Problem List Patient Active Problem List   Diagnosis Date Noted  . Depression 04/25/2014  . CAD in native artery 04/07/2014  . Spondylolisthesis at L4-L5 level 04/03/2014  . Esophageal reflux 02/25/2014  . Unspecified constipation 02/24/2014  . Sacral fracture 02/10/2014  . Sacral fracture, closed 02/08/2014  . DJD (degenerative joint disease) 02/08/2014  . Hypertension   . Aortic stenosis   . Chest pain   . Osteoporosis   . Spinal stenosis     Kary Kos, PT 03/30/2015, 10:53 PM  [facility] [address] [place], [place], [address] Phone: [phone]   Fax:  [phone]      

## 2015-04-09 ENCOUNTER — Ambulatory Visit: Payer: Medicare Other | Admitting: Physical Therapy

## 2015-04-09 ENCOUNTER — Encounter: Payer: Self-pay | Admitting: Physical Therapy

## 2015-04-09 DIAGNOSIS — H8112 Benign paroxysmal vertigo, left ear: Secondary | ICD-10-CM | POA: Diagnosis not present

## 2015-04-09 NOTE — Therapy (Signed)
[facility] [address] [place], [place], [address] Phone: [phone]   Fax:  [phone]  Physical Therapy Treatment  Patient Details  Name: [name] Fields MRN: [id number] Date of Birth: 06/14/1925 Referring Provider:  Merlene Laughter, MD  Encounter Date: 04/09/2015      PT End of Session - 04/09/15 1859    Visit Number 3   Number of Visits 3   Date for PT Re-Evaluation 04/12/15   Authorization Type UHC Medicare   Authorization Time Period 03-14-15 - 05-14-15   PT Start Time 1231   PT Stop Time 1317   PT Time Calculation (min) 46 min      Past Medical History  Diagnosis Date  . Coronary artery disease     1st diag. stenosis of 70%  . Hyperlipidemia   . Hypertension   . Aortic stenosis     mild  . Chest pain   . Osteoporosis   . Spinal stenosis   . Shingles   . Arthritis   . Hx stress fracture     Lumbar 4-5  . UTI (lower urinary tract infection)     Past Surgical History  Procedure Laterality Date  . Cardiac catheterization  10/18/2007    mild to moderate CAD,aggressive medical therapy  . Abdominal surgery      bowel obstruction  . Eye surgery      cataracts bil    There were no vitals filed for this visit.  Visit Diagnosis:  BPPV (benign paroxysmal positional vertigo), left      Subjective Assessment - 04/09/15 1853    Subjective Pt states she had no vertigo for about 3 days after previous rx session but then vertigo re-occurred and "has been pretty bad for past week"   Patient is accompained by: --  caregiver Alana   Pertinent History CAD:  osteoporosis; spinal stenosis; h/o L4 stress fracture due to fall sustained last summer   Patient Stated Goals resolve the vertigo   Currently in Pain? No/denies                          Vestibular Treatment/Exercise - 04/09/15 1251    Vestibular Treatment/Exercise   Vestibular Treatment Provided Canalith Repositioning   Canalith Repositioning Epley Manuever Left    EPLEY MANUEVER LEFT   Number of Reps  1   Overall Response  Symptoms Worsened     Bar-b-que roll x 2 reps for L horizontal canal BPPV;  Self Care; instructed pt and caregiver in repetitive rolling for L BPPV horizontal canal habituation  1 Rep Epley's completed prior to bar-b-que roll as horizontal nystagmus noted after L rotary nystagmus         PT Education - 04/09/15 1857    Education provided Yes   Education Details repetitive rolling for L horizontal canalithiasis;    Person(s) Educated Patient;Caregiver(s)   Methods Explanation;Demonstration   Comprehension Verbalized understanding;Returned demonstration             PT Long Term Goals - 03/14/15 1502    PT LONG TERM GOAL #1   Title Pt. will have a negative L Dix-Hallpike test to indicate that L BPPV has resolved.  04-12-15   Time 4   Period Weeks   Status New   PT LONG TERM GOAL #2   Title Pt. will report no vertigo with bed mobility.  04-12-15   Time 4   Period Weeks   Status New  PT LONG TERM GOAL #3   Title Independent in Brandt-Daroff exercises prn if BPPV not fully resolved.  04-12-15   Time 4   Period Weeks   Status New   PT LONG TERM GOAL #4   Title Verbalize plan to pursue home health PT to address balance deficits - pt states she is unable to come to OP on an ongoing basis due to lack of transportation.   Time 4   Period Weeks   Status New               Plan - 04/09/15 1901    Clinical Impression Statement Pt. has L horizontal canal BPPV with questionable R horizontal canal BPPV with L much more severe than R   Pt will benefit from skilled therapeutic intervention in order to improve on the following deficits Difficulty walking;Dizziness;Decreased balance   Rehab Potential Good   PT Frequency 2x / week   PT Duration 3 weeks   PT Treatment/Interventions ADLs/Self Care Home Management;Canalith Repostioning;Functional mobility training;Gait  training;Therapeutic activities;Therapeutic exercise;Balance training;Patient/family education;Vestibular   PT Next Visit Plan recheck L horizontal canal   Consulted and Agree with Plan of Care Patient;Family member/caregiver        Problem List Patient Active Problem List   Diagnosis Date Noted  . Depression 04/25/2014  . CAD in native artery 04/07/2014  . Spondylolisthesis at L4-L5 level 04/03/2014  . Esophageal reflux 02/25/2014  . Unspecified constipation 02/24/2014  . Sacral fracture 02/10/2014  . Sacral fracture, closed 02/08/2014  . DJD (degenerative joint disease) 02/08/2014  . Hypertension   . Aortic stenosis   . Chest pain   . Osteoporosis   . Spinal stenosis     Kary Kos, PT 04/09/2015, 7:04 PM  [facility] [address] [place], [place], [address] Phone: [phone]   Fax:  [phone]

## 2015-04-10 ENCOUNTER — Ambulatory Visit: Payer: Medicare Other | Admitting: Physical Therapy

## 2015-04-10 DIAGNOSIS — H8112 Benign paroxysmal vertigo, left ear: Secondary | ICD-10-CM | POA: Diagnosis not present

## 2015-04-11 ENCOUNTER — Encounter: Payer: Self-pay | Admitting: Physical Therapy

## 2015-04-11 NOTE — Therapy (Signed)
[facility] [address] [place], [place], [address] Phone: [phone]   Fax:  [phone]  Physical Therapy Treatment  Patient Details  Name: [name] Fields MRN: [id number] Date of Birth: 03-08-1925 Referring Provider:  Merlene Laughter, MD  Encounter Date: 04/10/2015      PT End of Session - 04/11/15 1855    Visit Number 4   Number of Visits 5   Date for PT Re-Evaluation 04/12/15   Authorization Type [employer] Medicare   Authorization Time Period 03-14-15 - 05-14-15   PT Start Time 1617   PT Stop Time 1659   PT Time Calculation (min) 42 min      Past Medical History  Diagnosis Date  . Coronary artery disease     1st diag. stenosis of 70%  . Hyperlipidemia   . Hypertension   . Aortic stenosis     mild  . Chest pain   . Osteoporosis   . Spinal stenosis   . Shingles   . Arthritis   . Hx stress fracture     Lumbar 4-5  . UTI (lower urinary tract infection)     Past Surgical History  Procedure Laterality Date  . Cardiac catheterization  10/18/2007    mild to moderate CAD,aggressive medical therapy  . Abdominal surgery      bowel obstruction  . Eye surgery      cataracts bil    There were no vitals filed for this visit.  Visit Diagnosis:  BPPV (benign paroxysmal positional vertigo), left      Subjective Assessment - 04/11/15 1850    Subjective Pt reports she has had very bad vertigo - tried rolling with assistance of caregiver at home   Pertinent History CAD:  osteoporosis; spinal stenosis; h/o L4 stress fracture due to fall sustained last summer   Patient Stated Goals resolve the vertigo   Currently in Pain? No/denies                          Vestibular Treatment/Exercise - 04/11/15 0001    Vestibular Treatment/Exercise   Canalith Repositioning Canal Roll Left  For L horizontal canal BPPV- bar b que roll Left x 3 reps      Repetitive rolling from supine to L side x 5 reps  for habituation; head turns from neutral to R to L for habituation Bar-b-que roll for L BPPV performed 3 times  Inconsistent improvement noted with habituation exercises (rolling) - nystagmus varied in intensity            PT Long Term Goals - 03/14/15 1502    PT LONG TERM GOAL #1   Title Pt. will have a negative L Dix-Hallpike test to indicate that L BPPV has resolved.  04-12-15   Time 4   Period Weeks   Status New   PT LONG TERM GOAL #2   Title Pt. will report no vertigo with bed mobility.  04-12-15   Time 4   Period Weeks   Status New   PT LONG TERM GOAL #3   Title Independent in Brandt-Daroff exercises prn if BPPV not fully resolved.  04-12-15   Time 4   Period Weeks   Status New   PT LONG TERM GOAL #4   Title Verbalize plan to pursue home health PT to address balance deficits - pt states she is unable to come to OP on an ongoing basis due to lack of transportation.  Time 4   Period Weeks   Status New               Plan - 04/11/15 1855    Clinical Impression Statement inconsistent nystagmus occurred with L head turns but direction is left beating horizontally - indicative of L horizontal canal BPPV   Pt will benefit from skilled therapeutic intervention in order to improve on the following deficits Difficulty walking;Dizziness;Decreased balance   Rehab Potential Good   PT Frequency 2x / week   PT Duration 3 weeks   PT Treatment/Interventions ADLs/Self Care Home Management;Canalith Repostioning;Functional mobility training;Gait training;Therapeutic activities;Therapeutic exercise;Balance training;Patient/family education;Vestibular   PT Next Visit Plan recheck L horizontal canal   PT Home Exercise Plan recommend balance - pt is going to pursue home health PT due to lack of transportation    Consulted and Agree with Plan of Care Patient;Family member/caregiver        Problem List Patient Active Problem List   Diagnosis Date Noted  . Depression 04/25/2014   . CAD in native artery 04/07/2014  . Spondylolisthesis at L4-L5 level 04/03/2014  . Esophageal reflux 02/25/2014  . Unspecified constipation 02/24/2014  . Sacral fracture 02/10/2014  . Sacral fracture, closed 02/08/2014  . DJD (degenerative joint disease) 02/08/2014  . Hypertension   . Aortic stenosis   . Chest pain   . Osteoporosis   . Spinal stenosis     Kary Kos, PT 04/11/2015, 6:58 PM  [facility] [address] [place], [place], [address] Phone: [phone]   Fax:  [phone]

## 2015-04-16 ENCOUNTER — Encounter: Payer: Medicare Other | Admitting: Physical Therapy

## 2015-04-17 ENCOUNTER — Ambulatory Visit: Payer: Medicare Other | Admitting: Physical Therapy

## 2015-04-30 ENCOUNTER — Ambulatory Visit: Payer: Medicare Other | Attending: Geriatric Medicine | Admitting: Physical Therapy

## 2015-04-30 DIAGNOSIS — H8112 Benign paroxysmal vertigo, left ear: Secondary | ICD-10-CM | POA: Diagnosis present

## 2015-05-01 ENCOUNTER — Encounter: Payer: Self-pay | Admitting: Physical Therapy

## 2015-05-01 NOTE — Therapy (Signed)
[facility] [address] [place], [place], [address] Phone: [phone]   Fax:  [phone]  Physical Therapy Treatment  Patient Details  Name: [name] Fields MRN: [id number] Date of Birth: Jan 04, 1925 Referring Provider:  Lajean Manes, MD  Encounter Date: 04/30/2015      PT End of Session - 05/01/15 2029    Visit Number 5   Number of Visits 5   Date for PT Re-Evaluation 04/12/15   Authorization Type [employer] Medicare   Authorization Time Period 03-14-15 - 05-14-15   PT Start Time 0846   PT Stop Time 0930   PT Time Calculation (min) 44 min      Past Medical History  Diagnosis Date  . Coronary artery disease     1st diag. stenosis of 70%  . Hyperlipidemia   . Hypertension   . Aortic stenosis     mild  . Chest pain   . Osteoporosis   . Spinal stenosis   . Shingles   . Arthritis   . Hx stress fracture     Lumbar 4-5  . UTI (lower urinary tract infection)     Past Surgical History  Procedure Laterality Date  . Cardiac catheterization  10/18/2007    mild to moderate CAD,aggressive medical therapy  . Abdominal surgery      bowel obstruction  . Eye surgery      cataracts bil    There were no vitals filed for this visit.  Visit Diagnosis:  BPPV (benign paroxysmal positional vertigo), left      Subjective Assessment - 05/01/15 2025    Subjective Pt reports vertigo is better when doing the repetitive rolling at home, but is now having vertigo with looking up or looking down   Patient is accompained by: --  caregiver Alana   Pertinent History CAD:  osteoporosis; spinal stenosis; h/o L4 stress fracture due to fall sustained last summer   Patient Stated Goals resolve the vertigo   Currently in Pain? No/denies                Vestibular Assessment - 05/01/15 0001    Positional Testing   Dix-Hallpike Dix-Hallpike Left;Dix-Hallpike Right   Sidelying Test Sidelying Left;Sidelying Right    Dix-Hallpike Right   Dix-Hallpike Right Duration none   Dix-Hallpike Right Symptoms No nystagmus   Dix-Hallpike Left   Dix-Hallpike Left Duration approx. 5 secs   Dix-Hallpike Left Symptoms Upbeat, left rotatory nystagmus   Sidelying Right   Sidelying Right Duration none   Sidelying Left   Sidelying Left Duration none  c/o vertigo with return to sitting   Positional Sensitivities   Sit to Supine No dizziness   Supine to Left Side No dizziness   Supine to Right Side No dizziness   Up from Right Hallpike Mild dizziness   Up from Left Hallpike Moderate dizziness   Rolling Right No dizziness   Rolling Left No dizziness     Self care; reviewed Brandt-Daroff exercises for self-treatment/habituation of vertigo with pt and caregiver    Adjusted RW correctly - pt gait trained approx. 30' with cues for positioning with use of RW  Canalith repositioning maneuver performed 3 reps for L BPPV (L posterior canalithiasis) Vertigo resolved by 3rd rep of Epely's per pt report and no nystagmus observed                     PT Long Term Goals - 05/01/15 2032    PT LONG  TERM GOAL #1   Title Pt. will have a negative L Dix-Hallpike test to indicate that L BPPV has resolved.  04-12-15   Baseline met after rx on May 03, 2015   Status Achieved   PT LONG TERM GOAL #2   Title Pt. will report no vertigo with bed mobility.  04-12-15   Baseline met May 03, 2015   Status Achieved   PT LONG TERM GOAL #3   Title Independent in Brandt-Daroff exercises prn if BPPV not fully resolved.  04-12-15   Baseline met 05/03/15   Status Achieved   PT LONG TERM GOAL #4   Title Verbalize plan to pursue home health PT to address balance deficits - pt states she is unable to come to OP on an ongoing basis due to lack of transportation.   Baseline pt states HH was started this past weekend   Status Achieved               Plan - 05/01/15 2030    Clinical Impression Statement Pt.'s vertigo (L BPPV) appears to be  resolved after 3rd rep of Epley's maneuver; all LTG's met with horizontal canal BPPV resolved prior to today's PT session   Pt will benefit from skilled therapeutic intervention in order to improve on the following deficits Difficulty walking;Dizziness;Decreased balance   Rehab Potential Good   PT Frequency 2x / week   PT Duration 3 weeks   PT Treatment/Interventions ADLs/Self Care Home Management;Canalith Repostioning;Functional mobility training;Gait training;Therapeutic activities;Therapeutic exercise;Balance training;Patient/family education;Vestibular   PT Next Visit Plan D/C   PT Home Exercise Plan recommend balance - pt is going to pursue home health PT due to lack of transportation    Consulted and Agree with Plan of Care Patient;Family member/caregiver   Family Member Consulted --  caregiver          G-Codes - 2015-05-03 14-Oct-2032    Functional Assessment Tool Used clinical judgment - (+) L Dix-Hallpike test indicative of L BPPV; no c/o vertigo after rx on May 03, 2015   Functional Limitation Other PT primary   Other PT Primary Goal Status (H5456) At least 1 percent but less than 20 percent impaired, limited or restricted   Other PT Primary Discharge Status (Y5638) At least 1 percent but less than 20 percent impaired, limited or restricted      Problem List Patient Active Problem List   Diagnosis Date Noted  . Depression 04/25/2014  . CAD in native artery 04/07/2014  . Spondylolisthesis at L4-L5 level 04/03/2014  . Esophageal reflux 02/25/2014  . Unspecified constipation 02/24/2014  . Sacral fracture 02/10/2014  . Sacral fracture, closed 02/08/2014  . DJD (degenerative joint disease) 02/08/2014  . Hypertension   . Aortic stenosis   . Chest pain   . Osteoporosis   . Spinal stenosis   PHYSICAL THERAPY DISCHARGE SUMMARY  Visits from Start of Care: 5  Current functional level related to goals / functional outcomes: See above for progress towards LTG's - all goals met    Remaining deficits: Some vertigo or light-headedness reported with supine to sit (intermittent report of this and not on every transfer) - possibly due to Occasional orthostatic hypotension  BPPV appears to be resolved at this time - after Epley's maneuver treatment today Education / Equipment: Pt. and caregiver have been instructed in a HEP for self treatment and habituation of vertigo; pt is going to receive home health PT to Address balance deficits (per pt's choice due to transportation difficulties) Plan: Patient agrees to discharge.  Patient goals  were met. Patient is being discharged due to meeting the stated rehab goals.  ?????       Alda Lea, PT 05/01/2015, 8:36 PM  [facility] [address] [place], [place], [address] Phone: [phone]   Fax:  [phone]

## 2015-05-23 ENCOUNTER — Other Ambulatory Visit: Payer: Self-pay | Admitting: Geriatric Medicine

## 2015-05-23 ENCOUNTER — Ambulatory Visit
Admission: RE | Admit: 2015-05-23 | Discharge: 2015-05-23 | Disposition: A | Discharge status: Home / Self Care | Payer: Medicare Other | Source: Ambulatory Visit | Attending: Geriatric Medicine | Admitting: Geriatric Medicine

## 2015-05-23 DIAGNOSIS — R059 Cough, unspecified: Secondary | ICD-10-CM

## 2015-05-23 DIAGNOSIS — R05 Cough: Secondary | ICD-10-CM

## 2016-06-06 IMAGING — MR MR LUMBAR SPINE W/O CM
4 of 5 series · 18 of 48 positions shown · non-contrast
Comparison: Radiography 03/09/2013.  MRI 01/21/2004.

CLINICAL DATA: Low back pain, 2 weeks duration.  Unable to walk.

EXAM:
MRI LUMBAR SPINE WITHOUT CONTRAST
TECHNIQUE: Multiplanar, multisequence MR imaging of the lumbar spine was
performed. No intravenous contrast was administered.

[Series 3: T2 · sagittal · 4.0mm · 0.55mm/px · 6 of 13 slices shown (1 of 2)]
[im 1/13]
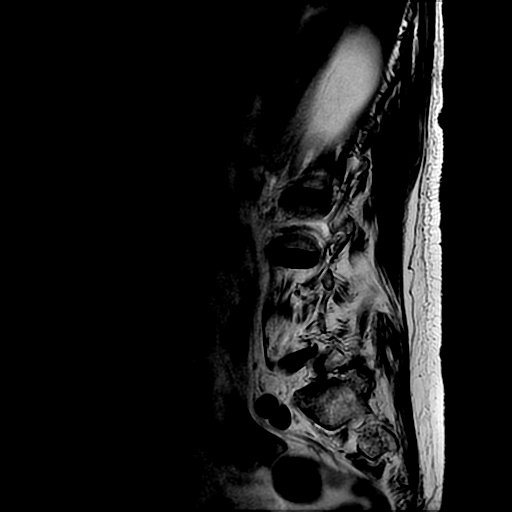
[im 3/13]
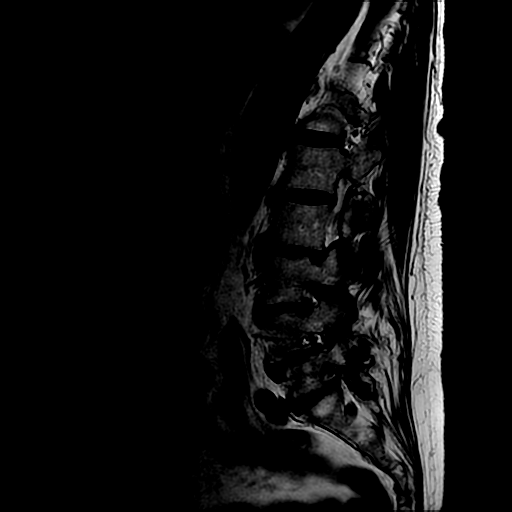
[im 5/13]
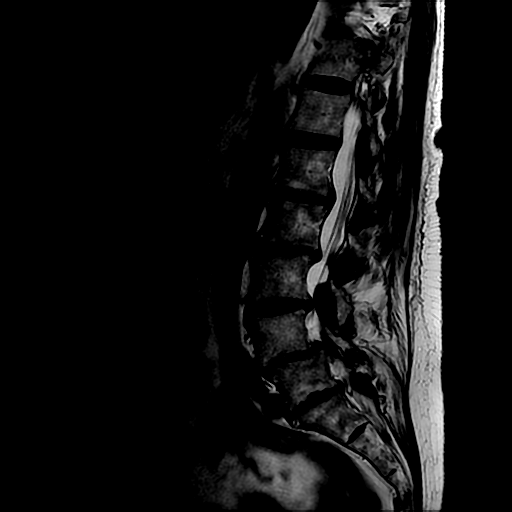
[im 8/13]
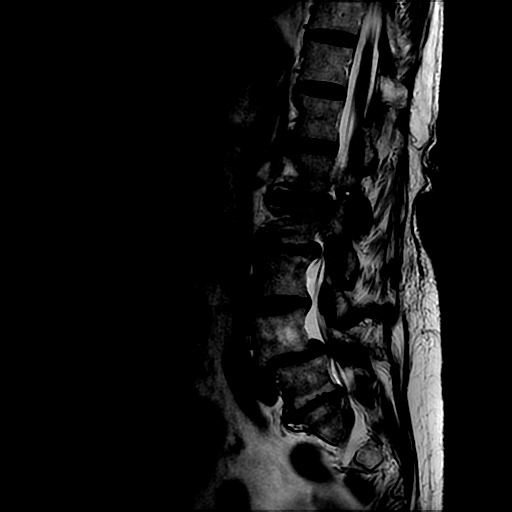
[im 10/13]
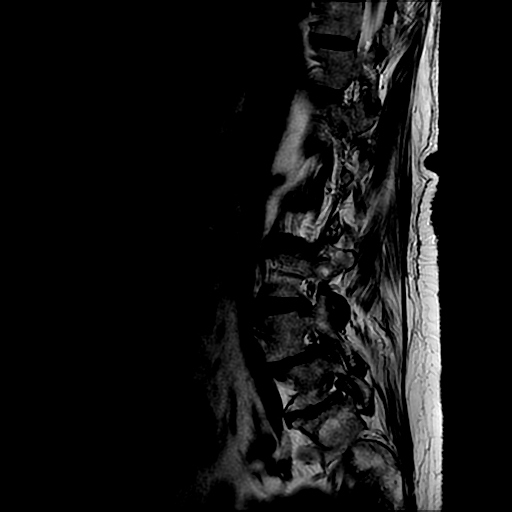
[im 13/13]
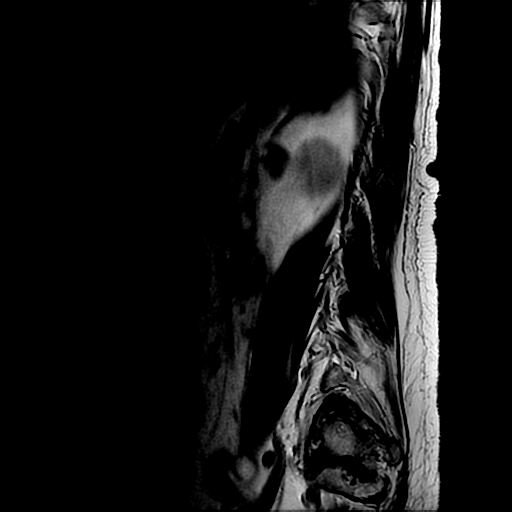

[Series 4: T1 · sagittal · 4.0mm · 0.55mm/px · 3 of 13 slices shown (1 of 2)]
[im 3/13]
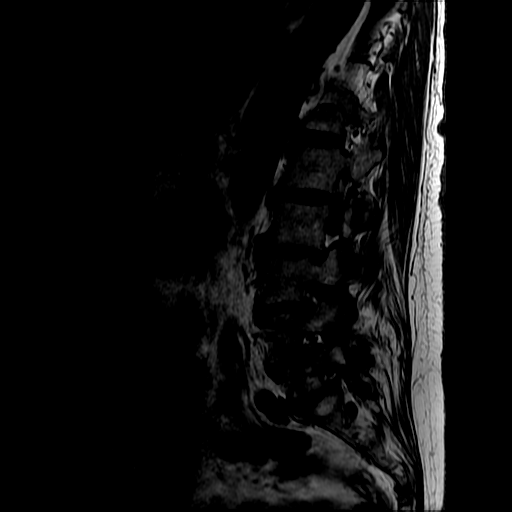
[im 7/13]
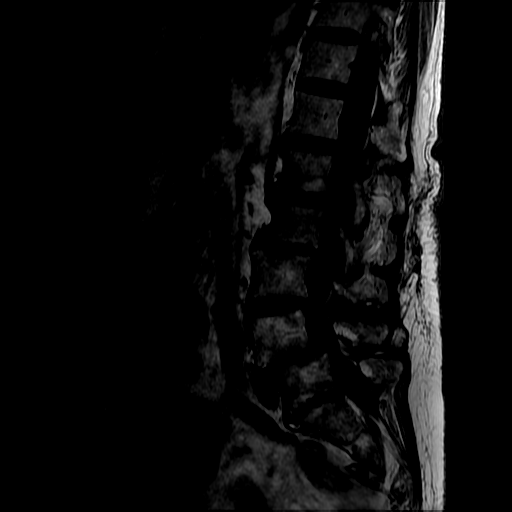
[im 11/13]
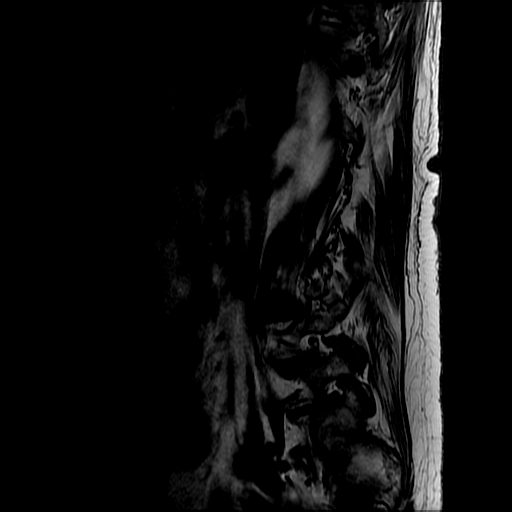

[Series 6: T2 · axial · 4.0mm · 0.39mm/px · z∈[-88,+54]mm · 6 of 28 slices shown (2 of 2)]
[im 1/28]
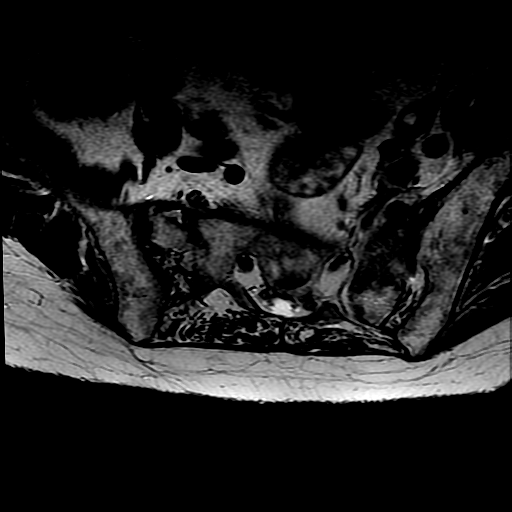
[im 5/28]
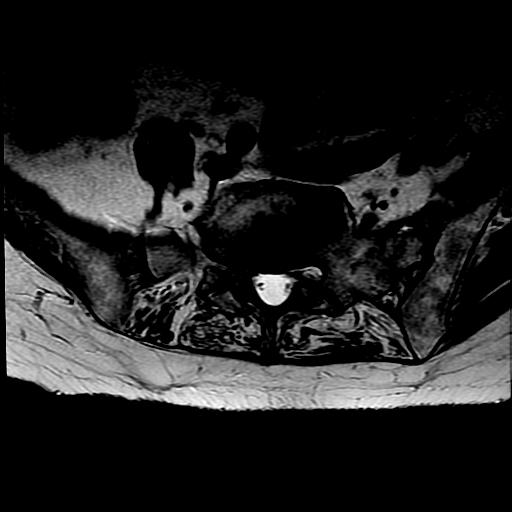
[im 9/28]
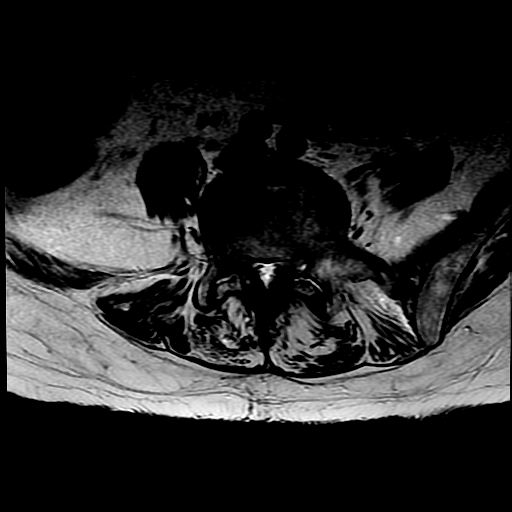
[im 13/28]
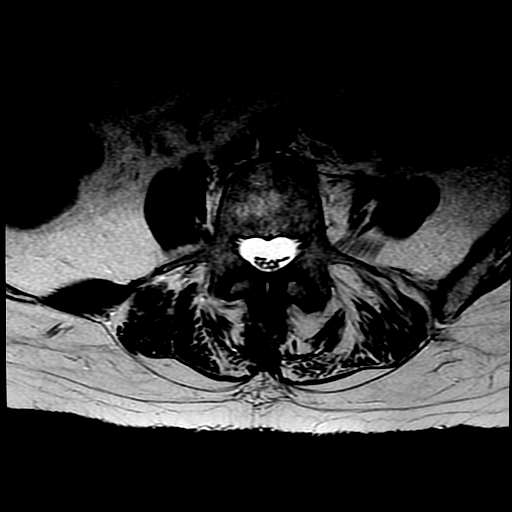
[im 15/28]
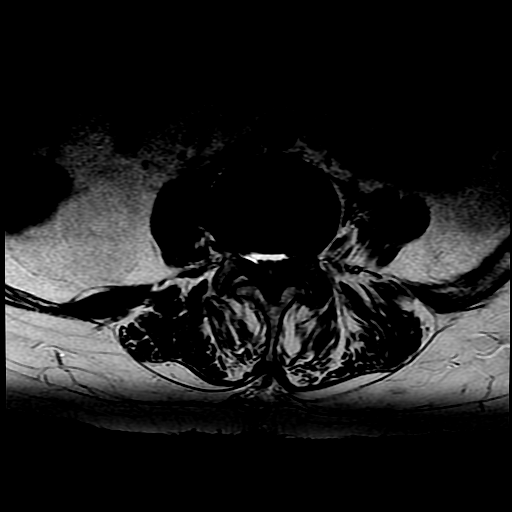
[im 23/28]
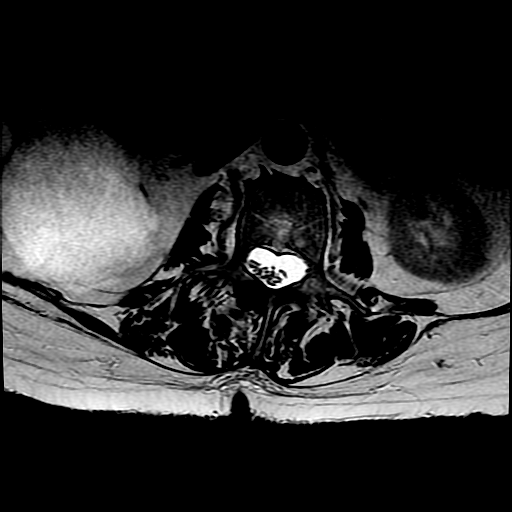

[Series 7: T1 · axial · 4.0mm · 0.39mm/px · z∈[-69,+54]mm · 3 of 28 slices shown (2 of 2)]
[im 5/28]
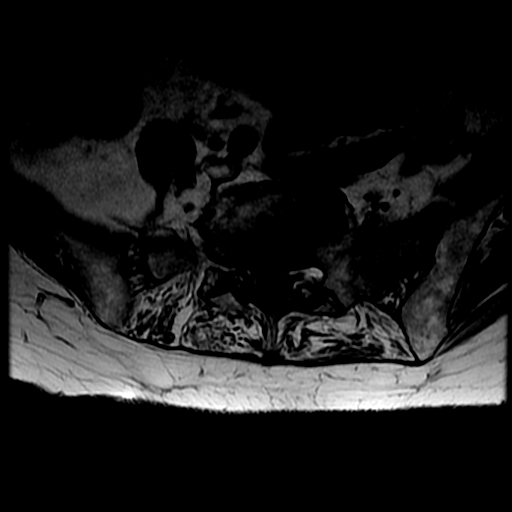
[im 15/28]
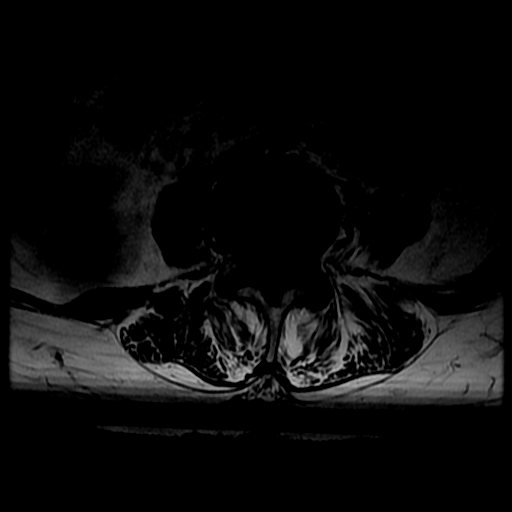
[im 23/28]
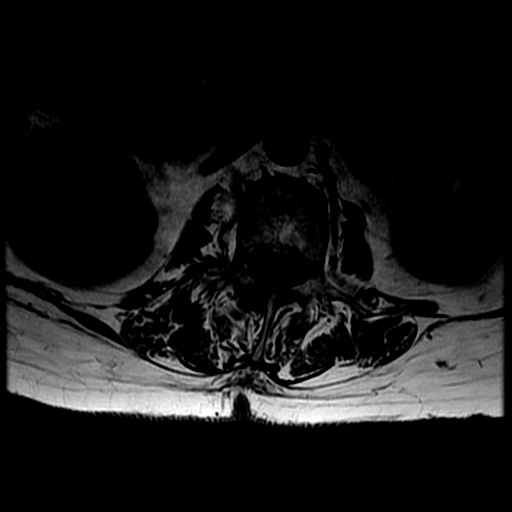

[18 of 48 positions shown; findings below may reference images not displayed]

FINDINGS: There are sacral insufficiency fractures, more extensive on the left
than the right. This is quite likely because of the acute syndrome.

There is curvature convex to the left with the apex at L1-2. T11-12
is normal. There is a shallow disc herniation at T12-L1 with slight
caudal down turning. No neural compression. Conus tip is at this
level.

L1-2: Disc degeneration more pronounced on the right with disc space
narrowing, endplate osteophytes and bulging of the disc. Mild facet
hypertrophy on the right. Mild stenosis of the right lateral recess
without gross neural compression.

L2-3: Retrolisthesis of 3 mm. Endplate osteophytes and bulging of
the disc. Mild facet and ligamentous hypertrophy. Mild stenosis of
both lateral recesses.

L3-4: Retrolisthesis of 2 mm. Circumferential bulging of the disc.
Facet and ligamentous hypertrophy. Moderate stenosis of the canal
and lateral recesses.

L4-5: Advanced bilateral facet arthropathy with anterolisthesis of 1
cm. Pseudo disc herniation. Severe stenosis of the canal, lateral
recesses and foramina at this level that could cause neural
compression.

L5-S1: Chronic disc degeneration with endplate osteophytes and
bulging of the disc. Bilateral facet hypertrophy. Stenosis of both
subarticular lateral recesses without definite neural compression.
IMPRESSION: Acute bilateral sacral insufficiency fractures, worse on the left
than the right, likely the cause of the acute pain syndrome.

Scoliosis convex to the left. Complex multi-level degenerative
disease, generally worsened since the study of 0888.

Bilateral lateral recess stenosis at L2-3 and L3-4 that could
possibly cause neural compression.

Severe multifactorial stenosis at L4-5 because of facet arthropathy
with 1 cm of anterolisthesis and degenerative disc disease.

## 2016-09-11 ENCOUNTER — Emergency Department (HOSPITAL_COMMUNITY): Payer: Medicare Other

## 2016-09-11 ENCOUNTER — Inpatient Hospital Stay (HOSPITAL_COMMUNITY): Payer: Medicare Other

## 2016-09-11 ENCOUNTER — Encounter (HOSPITAL_COMMUNITY): Payer: Self-pay | Admitting: Emergency Medicine

## 2016-09-11 ENCOUNTER — Inpatient Hospital Stay (HOSPITAL_COMMUNITY)
Admission: EM | Admit: 2016-09-11 | Discharge: 2016-09-18 | DRG: 061 | Disposition: E | Payer: Medicare Other | Attending: Neurology | Admitting: Neurology

## 2016-09-11 DIAGNOSIS — Y92239 Unspecified place in hospital as the place of occurrence of the external cause: Secondary | ICD-10-CM | POA: Diagnosis present

## 2016-09-11 DIAGNOSIS — I5189 Other ill-defined heart diseases: Secondary | ICD-10-CM

## 2016-09-11 DIAGNOSIS — S199XXA Unspecified injury of neck, initial encounter: Secondary | ICD-10-CM | POA: Diagnosis present

## 2016-09-11 DIAGNOSIS — I6300 Cerebral infarction due to thrombosis of unspecified precerebral artery: Secondary | ICD-10-CM

## 2016-09-11 DIAGNOSIS — M810 Age-related osteoporosis without current pathological fracture: Secondary | ICD-10-CM | POA: Diagnosis present

## 2016-09-11 DIAGNOSIS — R29810 Facial weakness: Secondary | ICD-10-CM | POA: Diagnosis present

## 2016-09-11 DIAGNOSIS — E785 Hyperlipidemia, unspecified: Secondary | ICD-10-CM | POA: Diagnosis present

## 2016-09-11 DIAGNOSIS — H510 Palsy (spasm) of conjugate gaze: Secondary | ICD-10-CM | POA: Diagnosis present

## 2016-09-11 DIAGNOSIS — Z79899 Other long term (current) drug therapy: Secondary | ICD-10-CM

## 2016-09-11 DIAGNOSIS — R29710 NIHSS score 10: Secondary | ICD-10-CM | POA: Diagnosis present

## 2016-09-11 DIAGNOSIS — I2510 Atherosclerotic heart disease of native coronary artery without angina pectoris: Secondary | ICD-10-CM

## 2016-09-11 DIAGNOSIS — R471 Dysarthria and anarthria: Secondary | ICD-10-CM | POA: Diagnosis present

## 2016-09-11 DIAGNOSIS — I639 Cerebral infarction, unspecified: Secondary | ICD-10-CM | POA: Diagnosis present

## 2016-09-11 DIAGNOSIS — Z66 Do not resuscitate: Secondary | ICD-10-CM | POA: Diagnosis present

## 2016-09-11 DIAGNOSIS — Z9842 Cataract extraction status, left eye: Secondary | ICD-10-CM | POA: Diagnosis not present

## 2016-09-11 DIAGNOSIS — E538 Deficiency of other specified B group vitamins: Secondary | ICD-10-CM

## 2016-09-11 DIAGNOSIS — M4800 Spinal stenosis, site unspecified: Secondary | ICD-10-CM | POA: Diagnosis present

## 2016-09-11 DIAGNOSIS — W06XXXA Fall from bed, initial encounter: Secondary | ICD-10-CM | POA: Diagnosis present

## 2016-09-11 DIAGNOSIS — G8194 Hemiplegia, unspecified affecting left nondominant side: Secondary | ICD-10-CM | POA: Diagnosis present

## 2016-09-11 DIAGNOSIS — R414 Neurologic neglect syndrome: Secondary | ICD-10-CM | POA: Diagnosis present

## 2016-09-11 DIAGNOSIS — D72829 Elevated white blood cell count, unspecified: Secondary | ICD-10-CM | POA: Diagnosis not present

## 2016-09-11 DIAGNOSIS — I6523 Occlusion and stenosis of bilateral carotid arteries: Secondary | ICD-10-CM | POA: Diagnosis present

## 2016-09-11 DIAGNOSIS — R000 Tachycardia, unspecified: Secondary | ICD-10-CM

## 2016-09-11 DIAGNOSIS — R400 Somnolence: Secondary | ICD-10-CM | POA: Diagnosis not present

## 2016-09-11 DIAGNOSIS — I63411 Cerebral infarction due to embolism of right middle cerebral artery: Secondary | ICD-10-CM | POA: Diagnosis present

## 2016-09-11 DIAGNOSIS — I350 Nonrheumatic aortic (valve) stenosis: Secondary | ICD-10-CM | POA: Diagnosis not present

## 2016-09-11 DIAGNOSIS — T783XXA Angioneurotic edema, initial encounter: Secondary | ICD-10-CM | POA: Diagnosis present

## 2016-09-11 DIAGNOSIS — I63311 Cerebral infarction due to thrombosis of right middle cerebral artery: Secondary | ICD-10-CM | POA: Diagnosis present

## 2016-09-11 DIAGNOSIS — S0990XA Unspecified injury of head, initial encounter: Secondary | ICD-10-CM | POA: Diagnosis present

## 2016-09-11 DIAGNOSIS — T45615A Adverse effect of thrombolytic drugs, initial encounter: Secondary | ICD-10-CM | POA: Diagnosis present

## 2016-09-11 DIAGNOSIS — I6789 Other cerebrovascular disease: Secondary | ICD-10-CM

## 2016-09-11 DIAGNOSIS — R200 Anesthesia of skin: Secondary | ICD-10-CM

## 2016-09-11 DIAGNOSIS — J9600 Acute respiratory failure, unspecified whether with hypoxia or hypercapnia: Secondary | ICD-10-CM | POA: Diagnosis not present

## 2016-09-11 DIAGNOSIS — R1310 Dysphagia, unspecified: Secondary | ICD-10-CM

## 2016-09-11 DIAGNOSIS — Z0189 Encounter for other specified special examinations: Secondary | ICD-10-CM

## 2016-09-11 DIAGNOSIS — R6510 Systemic inflammatory response syndrome (SIRS) of non-infectious origin without acute organ dysfunction: Secondary | ICD-10-CM | POA: Diagnosis not present

## 2016-09-11 DIAGNOSIS — M1990 Unspecified osteoarthritis, unspecified site: Secondary | ICD-10-CM | POA: Diagnosis present

## 2016-09-11 DIAGNOSIS — Z9841 Cataract extraction status, right eye: Secondary | ICD-10-CM | POA: Diagnosis not present

## 2016-09-11 DIAGNOSIS — H518 Other specified disorders of binocular movement: Secondary | ICD-10-CM | POA: Diagnosis present

## 2016-09-11 DIAGNOSIS — Y92003 Bedroom of unspecified non-institutional (private) residence as the place of occurrence of the external cause: Secondary | ICD-10-CM | POA: Diagnosis not present

## 2016-09-11 DIAGNOSIS — I10 Essential (primary) hypertension: Secondary | ICD-10-CM

## 2016-09-11 DIAGNOSIS — Z91048 Other nonmedicinal substance allergy status: Secondary | ICD-10-CM

## 2016-09-11 DIAGNOSIS — Z888 Allergy status to other drugs, medicaments and biological substances status: Secondary | ICD-10-CM

## 2016-09-11 DIAGNOSIS — Z7982 Long term (current) use of aspirin: Secondary | ICD-10-CM | POA: Diagnosis not present

## 2016-09-11 DIAGNOSIS — E782 Mixed hyperlipidemia: Secondary | ICD-10-CM

## 2016-09-11 DIAGNOSIS — R4182 Altered mental status, unspecified: Secondary | ICD-10-CM

## 2016-09-11 DIAGNOSIS — Z4659 Encounter for fitting and adjustment of other gastrointestinal appliance and device: Secondary | ICD-10-CM

## 2016-09-11 DIAGNOSIS — R1314 Dysphagia, pharyngoesophageal phase: Secondary | ICD-10-CM | POA: Diagnosis not present

## 2016-09-11 DIAGNOSIS — Z978 Presence of other specified devices: Secondary | ICD-10-CM

## 2016-09-11 DIAGNOSIS — E876 Hypokalemia: Secondary | ICD-10-CM | POA: Diagnosis not present

## 2016-09-11 DIAGNOSIS — Z515 Encounter for palliative care: Secondary | ICD-10-CM | POA: Diagnosis present

## 2016-09-11 DIAGNOSIS — R0602 Shortness of breath: Secondary | ICD-10-CM

## 2016-09-11 DIAGNOSIS — Z87898 Personal history of other specified conditions: Secondary | ICD-10-CM

## 2016-09-11 LAB — I-STAT TROPONIN, ED: Troponin i, poc: 0 ng/mL (ref 0.00–0.08)

## 2016-09-11 LAB — I-STAT CHEM 8, ED
BUN: 17 mg/dL (ref 6–20)
CHLORIDE: 108 mmol/L (ref 101–111)
CREATININE: 0.7 mg/dL (ref 0.44–1.00)
Calcium, Ion: 1.09 mmol/L — ABNORMAL LOW (ref 1.15–1.40)
Glucose, Bld: 104 mg/dL — ABNORMAL HIGH (ref 65–99)
HEMATOCRIT: 39 % (ref 36.0–46.0)
Hemoglobin: 13.3 g/dL (ref 12.0–15.0)
Potassium: 4.8 mmol/L (ref 3.5–5.1)
SODIUM: 140 mmol/L (ref 135–145)
TCO2: 25 mmol/L (ref 0–100)

## 2016-09-11 LAB — CBC
HCT: 38.4 % (ref 36.0–46.0)
Hemoglobin: 12.4 g/dL (ref 12.0–15.0)
MCH: 27.3 pg (ref 26.0–34.0)
MCHC: 32.3 g/dL (ref 30.0–36.0)
MCV: 84.4 fL (ref 78.0–100.0)
PLATELETS: 270 10*3/uL (ref 150–400)
RBC: 4.55 MIL/uL (ref 3.87–5.11)
RDW: 13.5 % (ref 11.5–15.5)
WBC: 8.9 10*3/uL (ref 4.0–10.5)

## 2016-09-11 LAB — COMPREHENSIVE METABOLIC PANEL
ALK PHOS: 54 U/L (ref 38–126)
ALT: 10 U/L — ABNORMAL LOW (ref 14–54)
AST: 19 U/L (ref 15–41)
Albumin: 3.6 g/dL (ref 3.5–5.0)
Anion gap: 9 (ref 5–15)
BUN: 11 mg/dL (ref 6–20)
CHLORIDE: 109 mmol/L (ref 101–111)
CO2: 22 mmol/L (ref 22–32)
CREATININE: 0.72 mg/dL (ref 0.44–1.00)
Calcium: 8.8 mg/dL — ABNORMAL LOW (ref 8.9–10.3)
GFR calc Af Amer: >60 mL/min (ref >60)
GFR calc non Af Amer: >60 mL/min (ref >60)
GLUCOSE: 108 mg/dL — AB (ref 65–99)
POTASSIUM: 4.2 mmol/L (ref 3.5–5.1)
SODIUM: 140 mmol/L (ref 135–145)
TOTAL PROTEIN: 6.7 g/dL (ref 6.5–8.1)
Total Bilirubin: 0.4 mg/dL (ref 0.3–1.2)

## 2016-09-11 LAB — PROTIME-INR
INR: 1.09
PROTHROMBIN TIME: 14.1 s (ref 11.4–15.2)

## 2016-09-11 LAB — MRSA PCR SCREENING: MRSA BY PCR: NEGATIVE

## 2016-09-11 LAB — DIFFERENTIAL
BASOS PCT: 1 %
Basophils Absolute: 0.1 10*3/uL (ref 0.0–0.1)
EOS PCT: 2 %
Eosinophils Absolute: 0.1 10*3/uL (ref 0.0–0.7)
LYMPHS PCT: 19 %
Lymphs Abs: 1.7 10*3/uL (ref 0.7–4.0)
MONO ABS: 0.6 10*3/uL (ref 0.1–1.0)
Monocytes Relative: 7 %
Neutro Abs: 6.5 10*3/uL (ref 1.7–7.7)
Neutrophils Relative %: 71 %

## 2016-09-11 LAB — CBG MONITORING, ED: Glucose-Capillary: 94 mg/dL (ref 65–99)

## 2016-09-11 LAB — APTT: aPTT: 27 seconds (ref 24–36)

## 2016-09-11 LAB — ECHOCARDIOGRAM COMPLETE: Weight: 2109.36 oz

## 2016-09-11 LAB — GLUCOSE, CAPILLARY: Glucose-Capillary: 126 mg/dL — ABNORMAL HIGH (ref 65–99)

## 2016-09-11 MED ORDER — STROKE: EARLY STAGES OF RECOVERY BOOK
Freq: Once | Status: DC
  Filled 2016-09-11: qty 1

## 2016-09-11 MED ORDER — METHYLPREDNISOLONE SODIUM SUCC 125 MG IJ SOLR
125.0000 mg | INTRAMUSCULAR | Status: DC
  Administered 2016-09-11 – 2016-09-13 (×11): 125 mg via INTRAVENOUS
  Filled 2016-09-11 (×11): qty 2

## 2016-09-11 MED ORDER — WHITE PETROLATUM GEL
Status: AC
  Administered 2016-09-11: 20:00:00
  Filled 2016-09-11: qty 1

## 2016-09-11 MED ORDER — DEXAMETHASONE SODIUM PHOSPHATE 4 MG/ML IJ SOLN
4.0000 mg | Freq: Once | INTRAMUSCULAR | Status: AC
  Administered 2016-09-11: 4 mg via INTRAVENOUS
  Filled 2016-09-11 (×2): qty 1

## 2016-09-11 MED ORDER — PANTOPRAZOLE SODIUM 40 MG IV SOLR
40.0000 mg | Freq: Every day | INTRAVENOUS | Status: DC
  Administered 2016-09-11 – 2016-09-15 (×5): 40 mg via INTRAVENOUS
  Filled 2016-09-11 (×5): qty 40

## 2016-09-11 MED ORDER — DEXAMETHASONE SODIUM PHOSPHATE 4 MG/ML IJ SOLN
4.0000 mg | Freq: Once | INTRAMUSCULAR | Status: AC
  Administered 2016-09-11: 4 mg via INTRAVENOUS

## 2016-09-11 MED ORDER — ACETAMINOPHEN 160 MG/5ML PO SOLN: 650.0000 mg | ORAL | Status: DC | PRN

## 2016-09-11 MED ORDER — ACETAMINOPHEN 650 MG RE SUPP
650.0000 mg | RECTAL | Status: DC | PRN
  Administered 2016-09-14 – 2016-09-16 (×2): 650 mg via RECTAL
  Filled 2016-09-11 (×2): qty 1

## 2016-09-11 MED ORDER — ALTEPLASE (STROKE) FULL DOSE INFUSION
0.9000 mg/kg | Freq: Once | INTRAVENOUS | Status: AC
  Administered 2016-09-11: 54 mg via INTRAVENOUS

## 2016-09-11 MED ORDER — FAMOTIDINE IN NACL 20-0.9 MG/50ML-% IV SOLN
20.0000 mg | Freq: Two times a day (BID) | INTRAVENOUS | Status: DC
  Administered 2016-09-11 – 2016-09-15 (×10): 20 mg via INTRAVENOUS
  Filled 2016-09-11 (×11): qty 50

## 2016-09-11 MED ORDER — LABETALOL HCL 5 MG/ML IV SOLN
20.0000 mg | Freq: Once | INTRAVENOUS | Status: AC
  Administered 2016-09-11: 20 mg via INTRAVENOUS
  Filled 2016-09-11: qty 4

## 2016-09-11 MED ORDER — ACETAMINOPHEN 325 MG PO TABS: 650.0000 mg | ORAL_TABLET | ORAL | Status: DC | PRN

## 2016-09-11 MED ORDER — NICARDIPINE HCL IN NACL 20-0.86 MG/200ML-% IV SOLN: 0.0000 mg/h | INTRAVENOUS | Status: DC

## 2016-09-11 MED ORDER — SODIUM CHLORIDE 0.9 % IV SOLN
INTRAVENOUS | Status: DC
  Administered 2016-09-11 – 2016-09-14 (×5): via INTRAVENOUS
  Administered 2016-09-15: 1000 mL via INTRAVENOUS

## 2016-09-11 MED ORDER — SENNOSIDES-DOCUSATE SODIUM 8.6-50 MG PO TABS
1.0000 | ORAL_TABLET | Freq: Every evening | ORAL | Status: DC | PRN
  Filled 2016-09-11: qty 1

## 2016-09-11 NOTE — ED Notes (Addendum)
Dr. Roxy Mannsster at bedside to evaluate swelling.  Pt is not c/o trouble breathing at this time.  Pt remains A& x 4.

## 2016-09-11 NOTE — Progress Notes (Signed)
Contact by RRT nurse. Patient noted to have some angioedema following tPA. I assessed the patient and noted angioedema lateralized to the left upper lip and the left half of the tongue. The patient has increased dysarthria but is breathing comfortably and managing her secretions. She was given Decadron 8 mg IV and started on Solumedrol 125 mg q6 and pepcid 20 mg bid. Symptoms showed some slight improvement after steroids, will continue to monitor closely.

## 2016-09-11 NOTE — ED Notes (Signed)
While doing swallow screen test, swelling in L side of her tongue noted.  Swallow screen test held at this time.  Dr. Roxy Mannsster notified.

## 2016-09-11 NOTE — Code Documentation (Addendum)
Code Stroke called en route at 1106  Stat head Ct and Labs done upon arrival.  Patient with waxing and waining symptoms since 1015am, Left side weakness and facial droop.  She apparently also fell this ajm and complains of neck tenderness.  CT neck done as well.  NIHSS 10  Unable to move left leg and drift of left arm, facial droop and decreased sensory, mild slurred speech.  Dr Roxy Mannsster at bedside to assess patient. Daughter in law at bedside.  Foley placed per orders TPA started at 1157. Symptoms improved to NIHSS 0-1  Mild swelling of lip on left prior to TPA.  Lip swelling noted to become larger and when asked to stick tongue out swelling noted.  Dr Roxy Mannsster notified.  Decadron given IV. Hematuria   Handoff with ED RN.  EDP aware.  Plan admit ICU

## 2016-09-11 NOTE — Progress Notes (Signed)
  Echocardiogram 2D Echocardiogram has been performed.  Nolon RodBrown, Tony 09/09/2016, 3:17 PM

## 2016-09-11 NOTE — Progress Notes (Signed)
Average urine output 20 cc/hr x5 hours. Bladder scanned no visible urine noted.abdomen nondistended.dr Roseanne Renostewart made aware, ordered to increase normal saline iv >100 hour. Will continue to monitor.

## 2016-09-11 NOTE — H&P (Signed)
Neurology Admission H&P  Reason for Admission: Acute ischemic stroke s/p tPA  CC: Left-sided weakness  HPI: This is an 57-yo RH woman who is brought to the ED by EMS as a CODE STROKE. History is obtained directly from the patient. I have obtained additional information from EMS and from the patient's daughter-in-law who is present at the bedside and who serves as that patient's primary caregiver.   The patient was in her usual state of good health until this morning when she fell from her bed. Her daughter-in-law reports that when she checked on her she noted that the left side of her mouth appeared twisted and she was having difficulty speaking. She called 911 and as she was on the phone with the operator the patient's symptoms completely resolved. EMS arrived and report that she initially had no deficits but subsequently developed weakness affecting the L face, arm, and leg with a R gaze deviation. This lasted for a few minutes but again completely resolved by the time they arrived at the ED. They activated CODE STROKE and I met the patient on her arrival in the ED with the rest of the stroke team.   On arrival in the ED the patient was free of deficits. She was taken for an emergent CT of the head. As we arrived in the CT scanner, she again developed a R gaze with L hemiparesis affecting the face, arm, and leg equally. This has persisted. CTH showed no acute abnormality. NIHSS was 10. SBP 150s-160s. After discussion with the patient's daughter-in-law, the decision was made to proceed with tPA. The patient received a bolus of 5 mg at 1157 with 49 mg infused over the next hour.   The patient's daughter-in-law reports that at baseline the patient is completely independent.   Last known well: 09/15/2016 at 1035 NHISS score: 10 mRS score: 0 tPA given?: Yes   PMH:  Past Medical History:  Diagnosis Date  . Aortic stenosis    mild  . Arthritis   . Chest pain   . Coronary artery disease    1st  diag. stenosis of 70%  . Hx stress fracture    Lumbar 4-5  . Hyperlipidemia   . Hypertension   . Osteoporosis   . Shingles   . Spinal stenosis   . UTI (lower urinary tract infection)     PSH:  Past Surgical History:  Procedure Laterality Date  . ABDOMINAL SURGERY     bowel obstruction  . CARDIAC CATHETERIZATION  10/18/2007   mild to moderate CAD,aggressive medical therapy  . EYE SURGERY     cataracts bil    Family history: History reviewed. No pertinent family history.  Social history:  Social History   Social History  . Marital status: Widowed    Spouse name: N/A  . Number of children: N/A  . Years of education: N/A   Occupational History  . Not on file.   Social History Main Topics  . Smoking status: Never Smoker  . Smokeless tobacco: Never Used  . Alcohol use No  . Drug use: No  . Sexual activity: No   Other Topics Concern  . Not on file   Social History Narrative  . No narrative on file    Current outpatient meds: No outpatient prescriptions have been marked as taking for the 08/26/2016 encounter [facility] Encounter).    Current inpatient meds:  Current Facility-Administered Medications  Medication Dose Route Frequency Provider Last Rate Last Dose  .  stroke: mapping  our early stages of recovery book   Does not apply Once Darrel Reach, MD      . 0.9 %  sodium chloride infusion   Intravenous Continuous Darrel Reach, MD      . acetaminophen (TYLENOL) tablet 650 mg  650 mg Oral Q4H PRN Darrel Reach, MD       Or  . acetaminophen (TYLENOL) solution 650 mg  650 mg Per Tube Q4H PRN Darrel Reach, MD       Or  . acetaminophen (TYLENOL) suppository 650 mg  650 mg Rectal Q4H PRN Darrel Reach, MD      . alteplase (ACTIVASE) 1 mg/mL infusion 54 mg  0.9 mg/kg Intravenous Once Darrel Reach, MD 54 mL/hr at 08/19/2016 1158 54 mg at 08/29/2016 1158  . labetalol (NORMODYNE,TRANDATE) injection 20 mg  20 mg Intravenous Once Darrel Reach, MD       And  . nicardipine (CARDENE) 88m in 0.86% saline 2081mIV infusion (0.1 mg/ml)  0-15 mg/hr Intravenous Continuous TiDarrel ReachMD      . pantoprazole (PROTONIX) injection 40 mg  40 mg Intravenous QHS TiDarrel ReachMD      . senna-docusate (Senokot-S) tablet 1 tablet  1 tablet Oral QHS PRN TiDarrel ReachMD       Current Outpatient Prescriptions  Medication Sig Dispense Refill  . amLODipine (NORVASC) 2.5 MG tablet Take 2.5 mg by mouth daily.    . Aspirin Buf,CaCarb-MgCarb-MgO, (BUFFERIN PO) Take 1 tablet by mouth at bedtime.    . Marland Kitchenspirin EC 81 MG tablet Take 81 mg by mouth daily.    . Marland Kitchenscitalopram (LEXAPRO) 10 MG tablet Take 10 mg by mouth daily.  6  . esomeprazole (NEXIUM) 20 MG capsule Take 20 mg by mouth daily.     . fluticasone (FLONASE) 50 MCG/ACT nasal spray Place 1 spray into both nostrils daily.    . Marland Kitchenisinopril (PRINIVIL,ZESTRIL) 5 MG tablet Take 5 mg by mouth daily.  6  . metoprolol succinate (TOPROL-XL) 25 MG 24 hr tablet Take 37.5 mg by mouth daily.    . mirabegron ER (MYRBETRIQ) 25 MG TB24 tablet Take 25 mg by mouth daily.      Allergies: Allergies  Allergen Reactions  . Tape Hives    ROS: As per HPI. A full 14-point review of systems was performed and is otherwise unremarkable.   PE:  BP 155/71   Pulse 77   Temp 99 F (37.2 C) (Oral)   Resp 18   Wt 59.8 kg (131 lb 13.4 oz)   SpO2 96%   BMI 25.75 kg/m   General: WDWN, no acute distress. AAO x4. Mild dysarthria. No aphasia. Follows commands briskly. Affect is bright with congruent mood. Comportment is normal.  HEENT: Normocephalic. Neck supple without LAD. MMM, OP clear. Dentition good. Sclerae anicteric. No conjunctival injection.  CV: Regular, 2/6 murmur LSB. Carotid pulses full and symmetric, no bruits. Distal pulses 2+ and symmetric.  Lungs: CTAB.  Abdomen: Soft, non-distended, non-tender. Bowel sounds present x4.  Extremities: No C/C/E. Neuro:  CN: Pupils are  equal and round. They are symmetrically reactive from 3-->2 mm. Visual fields are full to confrontation. She has a right gaze preference with difficulty directing her eyes past midline to the left. Facial sensation is intact to light touch. She has a mild L central VII pattern of weakness. Hearing is intact to conversational voice. Palate elevates symmetrically and uvula is midline. Voice is normal  in tone, pitch and quality. Bilateral SCM and trapezii are 5/5. Tongue is midline with normal bulk and mobility.  Motor: Normal bulk. Tone is diminished on the L. She has 2/5 strength in the L arm and L leg. No tremor or other abnormal movements.  Sensation: Light touch is diminished on the left arm.  DTRs: 3+, brisker on the L. She has clonus at the R ankle. Toes downgoing on the R, upgoing on the L.  Coordination: Finger-to-nose and heel-to-shin are without dysmetria on the R, cannot be performed on the L. Finger taps are normal on the R, cannot be performed on the L.  Gait: Not assessed due to hemiparesis.   Labs:  Lab Results  Component Value Date   WBC 8.9 09/06/2016   HGB 13.3 08/25/2016   HCT 39.0 08/22/2016   PLT 270 08/19/2016   GLUCOSE 104 (H) 09/03/2016   ALT 10 (L) 08/30/2016   AST 19 08/19/2016   NA 140 08/18/2016   K 4.8 09/15/2016   CL 108 09/10/2016   CREATININE 0.70 09/06/2016   BUN 17 08/21/2016   CO2 22 09/10/2016   INR 1.09 08/22/2016   Troponin 0.00 PTT 27  Imaging:  I have personally and independently reviewed the [facility] without contrast from today. This shows moderate diffuse generalized atrophy. There is a moderate burden of chronic small vessel ischemic disease in the bihemispheric white matter. No acute abnormality is evident.   Assessment and Plan:  1. Acute ischemic stroke: This is most consistent with a small vessel subcortical thrombotic stroke, possibly involving the R hemisphere. She is s/p tPA.   Admit to neuro ICU for close monitoring for first 24 hours  post-tPA with frequent neuro checks per protocol  Avoid antiplatelet agents and anticoagulation for the first 24 hours post-tPA   Monitor blood pressure closely, keeping SBP<180 and DBP<105 with short-acting easily titratable antihypertensives (i.e. Labetalol PRN, nicardipine or cleviprex infusion)  Avoid invasive procedures or punctures at non-compressible sites for the first 24 hours post-tPA  NPO until swallow evaluation passed  Initiate/continue statin for secondary stroke prevention  Will start Plavix 75 mg for secondary stroke prevention once 24 hours out from tPA as she was taking aspirin premorbidly  Avoid fever and hyperglycemia as these may result in extension of infarct and are associated with worse neurologic prognosis  MRI brain without contrast  MRA head without contrast  Carotid Dopplers  TTE  Check fasting lipids and hemoglobin a1c  PT/OT/SLP to evaluate and treat as needed   2. Left hemiparesis: This is acute, due to stroke. PT/OT/rehab.   3. Left arm numbness: This is acute, due to stroke. OT/rehab.   4. Dysarthria: This is acute, due to stroke. NPO until cleared by swallow eval. ST/rehab as needed.   This was discussed at length with the patient's daughter-in-law at the bedside. She in agreement with the plan as noted. She was given the opportunity to ask questions and these were addressed to her satisfaction.   The stroke team will assume care of the patient starting 09/12/16.

## 2016-09-11 NOTE — Progress Notes (Signed)
eLink Physician-Brief Progress Note Patient Name: [name] Fields DOB: 08/06/1925 MRN: [id number]   Date of Service  08/20/2016  HPI/Events of Note  Presented with R cva sp tpa and in nicu hemodynamically stable, not needing 02   eICU Interventions  No pccm interventions needed      Intervention Category Major Interventions: Change in mental status - evaluation and management  Sandrea HughsMichael Graesyn Schreifels 08/20/2016, 4:09 PM

## 2016-09-11 NOTE — ED Notes (Addendum)
ECHO in process, unable to do 1430 NIH stroke assessment

## 2016-09-11 NOTE — ED Provider Notes (Signed)
[facility]-EMERGENCY DEPT Provider Note   CSN: [id number] Arrival date & time: 09/07/2016  1119     History   Chief Complaint Chief Complaint  Patient presents with  . Code Stroke    HPI [name] Fields is a 83 y.o. female.  HPI  Patient presents with concern of new speech difficulty, facial droop, left-sided weakness. Initially the patient is alone, but the patient's daughter-in-law arrived, corroborates history. 90 minutes prior to our evaluation the patient was essentially normal, having a normal conversation, with no focal complaints per She did recall a mechanical fall, seemingly unremarkable, with minor head trauma, neck trauma with the edge of her bed. 30 minutes after that the patient was seen by her family member, found to have new left-sided facial droop, new speech difficulty. The patient herself is awake and alert, acknowledges weakness, difficulty with speech.   Past Medical History:  Diagnosis Date  . Aortic stenosis    mild  . Arthritis   . Chest pain   . Coronary artery disease    1st diag. stenosis of 70%  . Hx stress fracture    Lumbar 4-5  . Hyperlipidemia   . Hypertension   . Osteoporosis   . Shingles   . Spinal stenosis   . UTI (lower urinary tract infection)     Patient Active Problem List   Diagnosis Date Noted  . Depression 04/25/2014  . CAD in native artery 04/07/2014  . Spondylolisthesis at L4-L5 level 04/03/2014  . Esophageal reflux 02/25/2014  . Unspecified constipation 02/24/2014  . Sacral fracture (HCC) 02/10/2014  . Sacral fracture, closed (HCC) 02/08/2014  . DJD (degenerative joint disease) 02/08/2014  . Hypertension   . Aortic stenosis   . Chest pain   . Osteoporosis   . Spinal stenosis     Past Surgical History:  Procedure Laterality Date  . ABDOMINAL SURGERY     bowel obstruction  . CARDIAC CATHETERIZATION  10/18/2007   mild to moderate CAD,aggressive medical therapy  . EYE SURGERY     cataracts bil    OB History      No data available       Home Medications    Prior to Admission medications   Medication Sig Start Date End Date Taking? Authorizing Provider  amLODipine (NORVASC) 2.5 MG tablet Take 2.5 mg by mouth daily.    Historical Provider, MD  Aspirin Buf,CaCarb-MgCarb-MgO, (BUFFERIN PO) Take 1 tablet by mouth at bedtime.    Historical Provider, MD  aspirin EC 81 MG tablet Take 81 mg by mouth daily.    Historical Provider, MD  escitalopram (LEXAPRO) 10 MG tablet Take 10 mg by mouth daily. 02/20/15   Historical Provider, MD  lisinopril (PRINIVIL,ZESTRIL) 5 MG tablet Take 5 mg by mouth daily. 02/20/15   Historical Provider, MD  metoprolol tartrate (LOPRESSOR) 25 MG tablet Take 25 mg by mouth daily.     Historical Provider, MD  NEXIUM 24HR 20 MG capsule Take 20 mg by mouth daily. 03/12/15   Historical Provider, MD  oxybutynin (DITROPAN-XL) 10 MG 24 hr tablet Take 10 mg by mouth daily.    Historical Provider, MD    Family History History reviewed. No pertinent family history.  Social History Social History  Substance Use Topics  . Smoking status: Never Smoker  . Smokeless tobacco: Never Used  . Alcohol use No     Allergies   Other   Review of Systems Review of Systems  Constitutional:       Per  HPI, otherwise negative  HENT:       Per HPI, otherwise negative  Respiratory:       Per HPI, otherwise negative  Cardiovascular:       Per HPI, otherwise negative  Gastrointestinal: Negative for vomiting.  Endocrine:       Negative aside from HPI  Genitourinary:       Neg aside from HPI   Musculoskeletal:       Per HPI, otherwise negative  Skin: Negative.   Neurological: Positive for facial asymmetry and weakness. Negative for syncope.     Physical Exam Updated Vital Signs BP 161/70   Pulse 77   Temp 99 F (37.2 C) (Oral)   Resp 18   Wt 131 lb 13.4 oz (59.8 kg)   SpO2 96%   BMI 25.75 kg/m   Physical Exam  Constitutional: She is oriented to person, place, and time. No  distress.  Sickly appearing female awake, alert left side facial droop  HENT:  Head: Normocephalic and atraumatic.  Eyes: Conjunctivae and EOM are normal.  Patient seems to distinguish visual fields appropriately  Cardiovascular: Normal rate and regular rhythm.   Pulmonary/Chest: Effort normal and breath sounds normal. No stridor. No respiratory distress.  Abdominal: She exhibits no distension.  Musculoskeletal: She exhibits no edema.  Neurological: She is alert and oriented to person, place, and time. She displays atrophy. A cranial nerve deficit and sensory deficit is present. She exhibits abnormal muscle tone. She displays no seizure activity.  Clonus, right distal lower extremity, hemi-paresis, left upper and lower extremities, speech is slurred.   Skin: Skin is warm and dry.  Psychiatric: She has a normal mood and affect.  Nursing note and vitals reviewed.    ED Treatments / Results  Labs (all labs ordered are listed, but only abnormal results are displayed) Labs Reviewed  I-STAT CHEM 8, ED - Abnormal; Notable for the following:       Result Value   Glucose, Bld 104 (*)    Calcium, Ion 1.09 (*)    All other components within normal limits  PROTIME-INR  APTT  CBC  DIFFERENTIAL  COMPREHENSIVE METABOLIC PANEL  I-STAT TROPOININ, ED  CBG MONITORING, ED    EKG  EKG Interpretation None       Radiology Ct Cervical Spine Wo Contrast  Result Date: 2016/09/21 CLINICAL DATA:  Fall.  Weakness on the left side.  Neck tenderness. EXAM: CT CERVICAL SPINE WITHOUT CONTRAST TECHNIQUE: Multidetector CT imaging of the cervical spine was performed without intravenous contrast. Multiplanar CT image reconstructions were also generated. COMPARISON:  Cervical spine MRI 04/06/2009 FINDINGS: Alignment: Cervical kyphosis from degenerative changes. Mild anterolisthesis at C3-4 and C4-5 due to advanced facet arthropathy. Skull base and vertebrae: Negative for acute fracture. Soft tissues and  spinal canal: No prevertebral fluid or swelling. No visible canal hematoma. Disc levels: Facet arthropathy and disc degeneration without high-grade stenosis or notable progression since 2010. Upper chest: Subpleural fibrosis at the apices. IMPRESSION: No evidence of acute cervical spine injury. Electronically Signed   By: Marnee Spring M.D.   On: 2016/09/21 11:44   Ct Head Code Stroke W/o Cm  Result Date: 21-Sep-2016 CLINICAL DATA:  Code stroke. 83 year old female with left weakness and facial droop. Initial encounter. EXAM: CT HEAD WITHOUT CONTRAST TECHNIQUE: Contiguous axial images were obtained from the base of the skull through the vertex without intravenous contrast. COMPARISON:  Head CT without contrast 03/21/2015. Brain MRI 03/04/2015 FINDINGS: Brain: Stable cerebral volume. Stable mild ventricular  prominence. Patchy and confluent bilateral white matter hypodensity appears stable. No midline shift, mass effect, or evidence of intracranial mass lesion. No acute intracranial hemorrhage identified. No cortically based acute infarct identified. No cortical encephalomalacia identified. Vascular: Calcified atherosclerosis at the skull base. No suspicious intracranial vascular hyperdensity. Skull: No acute osseous abnormality identified. Sinuses/Orbits: Visualized paranasal sinuses and mastoids are stable and well pneumatized. Other: Stable and negative orbit and scalp soft tissues. ASPECTS (Alberta Stroke Program Early CT Score) - Ganglionic level infarction (caudate, lentiform nuclei, internal capsule, insula, M1-M3 cortex): 7 - Supraganglionic infarction (M4-M6 cortex): 3 Total score (0-10 with 10 being normal): 10 IMPRESSION: 1. Stable noncontrast CT appearance of the brain since 2016. No acute cortically based infarct or acute intracranial hemorrhage identified. 2. ASPECTS is 10. 3. The above was relayed via text pager to Dr. Bennett Scrape on 08/26/2016 at 11:38 . Electronically Signed   By: Odessa Fleming M.D.   On:  09/01/2016 11:38    Procedures Procedures (including critical care time)  Medications Ordered in ED Medications  alteplase (ACTIVASE) 1 mg/mL infusion 54 mg (not administered)     Initial Impression / Assessment and Plan / ED Course  I have reviewed the triage vital signs and the nursing notes.  Pertinent labs & imaging results that were available during my care of the patient were reviewed by me and considered in my medical decision making (see chart for details).  Clinical Course as of Sep 11 1156  Thu Sep 11, 2016  1146 Discussed risks and benefits of tPa w daughter in law  [RL]    Clinical Course User Index [RL] Gerhard Munch, MD    After patient began receiving TPA, this was initially well tolerated, but she subsequently developed angioedema on the neurologically affected side. Patient received steroids, antihistamines. No evidence for respiratory compromise.    Final Clinical Impressions(s) / ED Diagnoses  Stroke  This elderly female presents with new speech difficulty, left-sided hemiparesis. Patient's progression of symptoms after development less than 2 hours ago, and notable dysfunction made her a decent candidate for TPA. This was discussed with neurology, and the patient's family members at length. Patient received TPA, and although she had slight angioedema, she otherwise tolerated it well. Patient required admission to the ICU for further evaluation and management.  New Prescriptions New Prescriptions   No medications on file    EKG Interpretation  Date/Time:  Thursday September 11 2016 11:35:03 EST Ventricular Rate:  81 PR Interval:    QRS Duration: 100 QT Interval:  414 QTC Calculation: 481 R Axis:   -27 Text Interpretation:  Sinus rhythm Inferior infarct, old T wave abnormality Baseline wander Abnormal ekg Confirmed by Gerhard Munch  MD [phone]) on 09/15/2016 12:02:45 PM       CRITICAL CARE Performed by: Gerhard Munch Total critical care  time: 35 minutes Critical care time was exclusive of separately billable procedures and treating other patients. Critical care was necessary to treat or prevent imminent or life-threatening deterioration. Critical care was time spent personally by me on the following activities: development of treatment plan with patient and/or surrogate as well as nursing, discussions with consultants, evaluation of patient's response to treatment, examination of patient, obtaining history from patient or surrogate, ordering and performing treatments and interventions, ordering and review of laboratory studies, ordering and review of radiographic studies, pulse oximetry and re-evaluation of patient's condition.    Gerhard Munch, MD 09/09/2016 [phone]

## 2016-09-11 NOTE — ED Triage Notes (Signed)
Pt here from home as a code stroke after a fall this am . Pt arrives with left sided deficits , airway intact

## 2016-09-11 NOTE — ED Notes (Signed)
US at bedside for ECHO.

## 2016-09-12 ENCOUNTER — Inpatient Hospital Stay (HOSPITAL_COMMUNITY): Payer: Medicare Other

## 2016-09-12 DIAGNOSIS — I639 Cerebral infarction, unspecified: Secondary | ICD-10-CM

## 2016-09-12 DIAGNOSIS — Z87898 Personal history of other specified conditions: Secondary | ICD-10-CM

## 2016-09-12 DIAGNOSIS — Z888 Allergy status to other drugs, medicaments and biological substances status: Secondary | ICD-10-CM

## 2016-09-12 LAB — LIPID PANEL
Cholesterol: 217 mg/dL — ABNORMAL HIGH (ref 0–200)
HDL: 49 mg/dL (ref >40)
LDL Cholesterol: 153 mg/dL — ABNORMAL HIGH (ref 0–99)
Total CHOL/HDL Ratio: 4.4 RATIO
Triglycerides: 76 mg/dL (ref <150)
VLDL: 15 mg/dL (ref 0–40)

## 2016-09-12 MED ORDER — ASPIRIN 300 MG RE SUPP
300.0000 mg | Freq: Every day | RECTAL | Status: DC
Start: 2016-09-12 — End: 2016-09-16
  Administered 2016-09-12 – 2016-09-15 (×4): 300 mg via RECTAL
  Filled 2016-09-12 (×5): qty 1

## 2016-09-12 MED ORDER — ATORVASTATIN CALCIUM 40 MG PO TABS
40.0000 mg | ORAL_TABLET | Freq: Every day | ORAL | Status: DC
  Administered 2016-09-15: 40 mg via ORAL
  Filled 2016-09-12 (×2): qty 1

## 2016-09-12 NOTE — Progress Notes (Signed)
Modified Barium Swallow Progress Note  Patient Details  Name: Fernande BrasJacqueline N Kindred MRN: [id number] Date of Birth: [birth year]  Today's Date: 09/12/2016  Modified Barium Swallow completed.  Full report located under Chart Review in the Imaging Section.  Brief recommendations include the following:  Clinical Impression  Pt has a moderate-severe dysphagia due to sensory motor deficits. She has left-sided weakness that impacts her ability to control a cohesive bolus and efficiently propel it toward her pharynx. She has incomplete velopharyngeal seal and intermittent nasal regurgitation, concerning for CN X involvement. Pt's pharyngeal trigger is delayed and she has diffuse weakness, which results in deep, silent penetration before the swallow with nectar thick liquids and after the swallow with honey thick liquids. Thin liquids are aspirated before the swallow and while it elicits a cough response, it is too weak to clear the aspirates. Diffuse residue remains after the swallow with all consistencies, but particularly with pureed solids, which puts her at risk for aspiration of residuals. Pt was unable to follow commands to utilize swallowing strategies. Recommend to remain NPO for now. SLP to f/u for strengthening exercises and potential implementation of strategies with improved mentation.   Swallow Evaluation Recommendations       SLP Diet Recommendations: NPO       Medication Administration: Via alternative means               Oral Care Recommendations: Oral care QID   Other Recommendations: Have oral suction available    [name], Laure Leone 09/12/2016,2:59 PM   [name], M.A. CCC-SLP [phone]

## 2016-09-12 NOTE — Evaluation (Signed)
Speech Language Pathology Evaluation Patient Details Name: [name] Fields MRN: [id number] DOB: [birth year] Today's Date: 09/12/2016 Time: [id number] SLP Time Calculation (min) (ACUTE ONLY): 8 min  Problem List:  Patient Active Problem List   Diagnosis Date Noted  . History of adverse reaction to tissue plasminogen activator (tPA) 09/12/2016  . Acute ischemic stroke (HCC) 2017/07/25  . Depression 04/25/2014  . CAD in native artery 04/07/2014  . Spondylolisthesis at L4-L5 level 04/03/2014  . Esophageal reflux 02/25/2014  . Unspecified constipation 02/24/2014  . Sacral fracture (HCC) 02/10/2014  . Sacral fracture, closed (HCC) 02/08/2014  . DJD (degenerative joint disease) 02/08/2014  . Hypertension   . Aortic stenosis   . Chest pain   . Osteoporosis   . Spinal stenosis    Past Medical History:  Past Medical History:  Diagnosis Date  . Aortic stenosis    mild  . Arthritis   . Chest pain   . Coronary artery disease    1st diag. stenosis of 70%  . Hx stress fracture    Lumbar 4-5  . Hyperlipidemia   . Hypertension   . Osteoporosis   . Shingles   . Spinal stenosis   . UTI (lower urinary tract infection)    Past Surgical History:  Past Surgical History:  Procedure Laterality Date  . ABDOMINAL SURGERY     bowel obstruction  . CARDIAC CATHETERIZATION  10/18/2007   mild to moderate CAD,aggressive medical therapy  . EYE SURGERY     cataracts bil   HPI:  83-yo RH woman who is brought to the ED with difficulty speaking. She developed left-sided weakness and R gfaze deviation with symptoms fluctuating until after she arrived in the ED. CT Head showed no acute changes, and tPA was initiated. Pt had left-sided angioedema of the lip and tongue that increased during administration. MRI showed acute infarct right lenticular nucleus with 7 mm focus of hemorrhage within the infarct.   Assessment / Plan / Recommendation Clinical Impression  Pt is oriented to person and location  only, and shows left inattention with right sided gaze preference. She has reduced sustained attention and intellectual awareness of deficits. Her speech is moderately dysarthric due to imprecise articulation from CN VII and XII deficits.     SLP Assessment  Patient needs continued Speech Lanaguage Pathology Services    Follow Up Recommendations  Inpatient Rehab    Frequency and Duration min 2x/week  2 weeks      SLP Evaluation Cognition  Overall Cognitive Status: Impaired/Different from baseline Arousal/Alertness: Awake/alert Orientation Level: Oriented to person;Oriented to place;Disoriented to time;Disoriented to situation Attention: Sustained Sustained Attention: Impaired Sustained Attention Impairment: Verbal basic;Functional basic Memory: Impaired Memory Impairment: Decreased recall of new information Awareness: Impaired Awareness Impairment: Intellectual impairment;Emergent impairment;Anticipatory impairment Problem Solving: Impaired Problem Solving Impairment: Verbal basic;Functional basic Safety/Judgment: Impaired Comments: left inattention       Comprehension  Auditory Comprehension Overall Auditory Comprehension: Impaired Commands: Impaired One Step Basic Commands: 50-74% accurate    Expression Expression Primary Mode of Expression: Verbal Verbal Expression Overall Verbal Expression: Appears within functional limits for tasks assessed   Oral / Motor  Oral Motor/Sensory Function Overall Oral Motor/Sensory Function: Moderate impairment Facial ROM: Reduced left;Suspected CN VII (facial) dysfunction Facial Symmetry: Abnormal symmetry left;Suspected CN VII (facial) dysfunction Facial Strength: Reduced left;Suspected CN VII (facial) dysfunction Lingual ROM: Reduced left;Suspected CN XII (hypoglossal) dysfunction Lingual Symmetry: Abnormal symmetry left;Suspected CN XII (hypoglossal) dysfunction Lingual Strength: Reduced;Suspected CN XII (hypoglossal)  dysfunction Motor Speech Overall  Motor Speech: Impaired Respiration: Within functional limits Phonation: Normal Resonance: Within functional limits Articulation: Impaired Level of Impairment: Sentence Intelligibility: Intelligibility reduced Sentence: 50-74% accurate Motor Planning: Witnin functional limits Motor Speech Errors: Not applicable   GO                    Maxcine Ham 09/12/2016, 2:35 PM  Maxcine Ham, M.A. CCC-SLP [phone]

## 2016-09-12 NOTE — Progress Notes (Signed)
Discussed new results of Head Ct scan with Dr Roseanne RenoStewart. Will continue to monitor.

## 2016-09-12 NOTE — Progress Notes (Signed)
Will do head CT scan this morning per Dr Roseanne RenoStewart due to pt having new confusion, Left sided drift. Neglect seems more too.

## 2016-09-12 NOTE — Progress Notes (Signed)
*  PRELIMINARY RESULTS* Vascular Ultrasound Carotid Duplex (Doppler) has been completed.  Preliminary findings: Bilateral 1-39% ICA stenosis, antegrade vertebral flow.   Chauncey FischerCharlotte C Aunika Kirsten 09/12/2016, 2:50 PM

## 2016-09-12 NOTE — Evaluation (Signed)
Clinical/Bedside Swallow Evaluation Patient Details  Name: [name] Fields MRN: [id number] Date of Birth: April 22, 1925  Today's Date: 09/12/2016 Time: SLP Start Time (ACUTE ONLY): 1116 SLP Stop Time (ACUTE ONLY): 1127 SLP Time Calculation (min) (ACUTE ONLY): 11 min  Past Medical History:  Past Medical History:  Diagnosis Date  . Aortic stenosis    mild  . Arthritis   . Chest pain   . Coronary artery disease    1st diag. stenosis of 70%  . Hx stress fracture    Lumbar 4-5  . Hyperlipidemia   . Hypertension   . Osteoporosis   . Shingles   . Spinal stenosis   . UTI (lower urinary tract infection)    Past Surgical History:  Past Surgical History:  Procedure Laterality Date  . ABDOMINAL SURGERY     bowel obstruction  . CARDIAC CATHETERIZATION  10/18/2007   mild to moderate CAD,aggressive medical therapy  . EYE SURGERY     cataracts bil   HPI:  [name] who is brought to the ED with difficulty speaking. She developed left-sided weakness and R gfaze deviation with symptoms fluctuating until after she arrived in the ED. CT Head showed no acute changes, and tPA was initiated. Pt had left-sided angioedema of the lip and tongue that increased during administration. MRI showed acute infarct right lenticular nucleus with 7 mm focus of hemorrhage within the infarct.   Assessment / Plan / Recommendation Clinical Impression  Pt has moderate left-sided weakness wtih CN VII and XII involvement. She has left-sided anterior loss and multiple audible swallows, concerning for discoordinated swallowing. Recommend to proceed with MBS to more objectively assess safety with intake.    Aspiration Risk  Moderate aspiration risk    Diet Recommendation NPO   Medication Administration: Via alternative means    Other  Recommendations Oral Care Recommendations: Oral care QID   Follow up Recommendations  (tba)      Frequency and Duration            Prognosis Prognosis for Safe Diet  Advancement: Good Barriers to Reach Goals: Cognitive deficits      Swallow Study   General HPI: [name] who is brought to the ED with difficulty speaking. She developed left-sided weakness and R gfaze deviation with symptoms fluctuating until after she arrived in the ED. CT Head showed no acute changes, and tPA was initiated. Pt had left-sided angioedema of the lip and tongue that increased during administration. MRI showed acute infarct right lenticular nucleus with 7 mm focus of hemorrhage within the infarct. Type of Study: Bedside Swallow Evaluation Previous Swallow Assessment: none in chart Diet Prior to this Study: NPO Temperature Spikes Noted: No Respiratory Status: Room air History of Recent Intubation: No Behavior/Cognition: Alert;Cooperative;Confused;Pleasant mood;Distractible;Requires cueing Oral Cavity Assessment: Within Functional Limits Oral Care Completed by SLP: Yes Vision: Functional for self-feeding Self-Feeding Abilities: Needs assist Patient Positioning: Upright in bed Baseline Vocal Quality: Normal Volitional Cough: Weak Volitional Swallow: Able to elicit    Oral/Motor/Sensory Function Overall Oral Motor/Sensory Function: Moderate impairment Facial ROM: Reduced left;Suspected CN VII (facial) dysfunction Facial Symmetry: Abnormal symmetry left;Suspected CN VII (facial) dysfunction Facial Strength: Reduced left;Suspected CN VII (facial) dysfunction Lingual ROM: Reduced left;Suspected CN XII (hypoglossal) dysfunction Lingual Symmetry: Abnormal symmetry left;Suspected CN XII (hypoglossal) dysfunction Lingual Strength: Reduced;Suspected CN XII (hypoglossal) dysfunction   Ice Chips Ice chips: Impaired Presentation: Spoon Oral Phase Impairments: Reduced labial seal;Poor awareness of bolus   Thin Liquid Thin Liquid: Impaired Presentation: Spoon;Cup  Oral Phase Impairments: Reduced labial seal;Poor awareness of bolus Oral Phase Functional Implications: Left  anterior spillage Pharyngeal  Phase Impairments: Suspected delayed Swallow;Multiple swallows;Wet Vocal Quality    Nectar Thick Nectar Thick Liquid: Not tested   Honey Thick Honey Thick Liquid: Not tested   Puree Puree: Impaired Presentation: Spoon Oral Phase Impairments: Reduced labial seal;Poor awareness of bolus Oral Phase Functional Implications: Prolonged oral transit   Solid   GO   Solid: Not tested        [name] Fields, [name] Fields 09/12/2016,1:02 PM  [name] Fields, [name] Fields [phone]

## 2016-09-12 NOTE — Progress Notes (Signed)
PT Cancellation Note  Patient Details Name: [name] Fields MRN: [id number] DOB: 1925/03/07   Cancelled Treatment:    Reason Eval/Treat Not Completed: Patient not medically ready.  Pt currently on strict bedrest and note new CT results with evolving infarct.  Will hold PT and mobility at this time.  Please advance activity orders once pt is appropriate for PT and mobility.     Alison MurrayMegan F Weston Kallman 09/12/2016, 8:10 AM

## 2016-09-12 NOTE — Progress Notes (Signed)
STROKE TEAM PROGRESS NOTE   HISTORY OF PRESENT ILLNESS (per record) This is an 83-yo RH woman who is brought to the ED by EMS as a CODE STROKE. History is obtained directly from the patient. I have obtained additional information from EMS and from the patient's daughter-in-law who is present at the bedside and who serves as that patient's primary caregiver.   The patient was in her usual state of good health until this morning when she fell from her bed. Her daughter-in-law reports that when she checked on her she noted that the left side of her mouth appeared twisted and she was having difficulty speaking. She called 911 and as she was on the phone with the operator the patient's symptoms completely resolved. EMS arrived and report that she initially had no deficits but subsequently developed weakness affecting the L face, arm, and leg with a R gaze deviation. This lasted for a few minutes but again completely resolved by the time they arrived at the ED. They activated CODE STROKE and I met the patient on her arrival in the ED with the rest of the stroke team.   On arrival in the ED the patient was free of deficits. She was taken for an emergent CT of the head. As we arrived in the CT scanner, she again developed a R gaze with L hemiparesis affecting the face, arm, and leg equally. This has persisted. CTH showed no acute abnormality. NIHSS was 10. SBP 150s-160s. After discussion with the patient's daughter-in-law, the decision was made to proceed with tPA. The patient received a bolus of 5 mg at 1157 with 49 mg infused over the next hour.   The patient's daughter-in-law reports that at baseline the patient is completely independent.   Last known well: 08/25/2016 at 1035 NHISS score: 10 mRS score: 0 tPA given?: Yes   SUBJECTIVE (INTERVAL HISTORY) No family members present. The patient is able to follow commands. She has a left hemiplegia as well as a left field cut.   OBJECTIVE Temp:  [97.6 F  (36.4 C)-99.3 F (37.4 C)] 99.1 F (37.3 C) (01/26 0715) Pulse Rate:  [64-114] 113 (01/26 0715) Cardiac Rhythm: Normal sinus rhythm (01/25 2000) Resp:  [12-23] 16 (01/26 0715) BP: (113-185)/(55-115) 162/97 (01/26 0715) SpO2:  [92 %-99 %] 95 % (01/26 0715) Weight:  [58.1 kg (128 lb)-59.8 kg (131 lb 13.4 oz)] 58.1 kg (128 lb) (01/25 1600)  CBC:  Recent Labs Lab 08/28/2016 1123 08/19/2016 1132  WBC 8.9  --   NEUTROABS 6.5  --   HGB 12.4 13.3  HCT 38.4 39.0  MCV 84.4  --   PLT 270  --     Basic Metabolic Panel:  Recent Labs Lab 09/10/2016 1123 08/26/2016 1132  NA 140 140  K 4.2 4.8  CL 109 108  CO2 22  --   GLUCOSE 108* 104*  BUN 11 17  CREATININE 0.72 0.70  CALCIUM 8.8*  --     Lipid Panel:    Component Value Date/Time   CHOL 217 (H) 09/12/2016 0324   TRIG 76 09/12/2016 0324   HDL 49 09/12/2016 0324   CHOLHDL 4.4 09/12/2016 0324   VLDL 15 09/12/2016 0324   LDLCALC 153 (H) 09/12/2016 0324   HgbA1c: No results found for: HGBA1C Urine Drug Screen: No results found for: LABOPIA, COCAINSCRNUR, LABBENZ, AMPHETMU, THCU, LABBARB    IMAGING  Ct Head Wo Contrast 09/12/2016 1. Evolving acute ischemic infarct within the right basal ganglia without significant mass effect.  Faint 3 mm hyperdensity within this region may reflect a small focus of petechial hemorrhage versus artifact.  Attention at follow-up recommended. No frank hemorrhagic transformation.  2. Otherwise stable appearance of the brain.    Ct Cervical Spine Wo Contrast 08/28/2016 No evidence of acute cervical spine injury.    Ct Head Code Stroke W/o Cm 09/08/2016 1. Stable noncontrast CT appearance of the brain since 2016. No acute cortically based infarct or acute intracranial hemorrhage identified.  2. ASPECTS is 10.     Ct Head Wo Contrast - post t-PA  09/12/2016 1. Evolving acute ischemic infarct within the right basal ganglia without significant mass effect.  Faint 3 mm hyperdensity within this region  may reflect a small focus of petechial hemorrhage versus artifact.  Attention at follow-up recommended. No frank hemorrhagic transformation. 2. Otherwise stable appearance of the brain.   MRI / MRA Brain Wo Contrast  09/12/2016 MRI Acute infarct right lenticular nucleus. 7 mm focus of hemorrhage within the infarct. Atrophy with chronic microvascular ischemia  MRA  Images degraded by motion and intracranial vessel tortuosity. Moderate focal stenosis proximal right posterior cerebral artery. Focal signal loss right M1 segment likely due to tortuosity. Moderate stenosis right A2 segment.   Transthoracic Echocardiogram 07/12/2016 Study Conclusions - Left ventricle: The cavity size was normal. There was moderate   concentric hypertrophy. Systolic function was vigorous. The   estimated ejection fraction was in the range of 65% to 70%. Wall   motion was normal; there were no regional wall motion   abnormalities. Doppler parameters are consistent with abnormal   left ventricular relaxation (grade 1 diastolic dysfunction).   Doppler parameters are consistent with elevated ventricular   end-diastolic filling pressure. - Aortic valve: Trileaflet; normal thickness leaflets. There was   mild regurgitation. - Mitral valve: Calcified annulus. There was mild regurgitation. - Left atrium: The atrium was normal in size. - Right ventricle: Systolic function was normal. - Tricuspid valve: There was no regurgitation. - Pulmonary arteries: Systolic pressure could not be accurately   estimated. - Inferior vena cava: The vessel was normal in size. - Pericardium, extracardiac: There was no pericardial effusion. Impressions: No cardiac source of emboli was indentified.   PHYSICAL EXAM Frail elderly caucasian lady not in distress. . Afebrile. Head is nontraumatic. Neck is supple without bruit.    Cardiac exam no murmur or gallop. Lungs are clear to auscultation. Distal pulses are well  felt.  Neurological Exam :  Drowsy but arouses easily. Right gaze preference but able to look to the left and midline. Blinks to threat on the right but not the left. Pupils are equal reactive. Fundi were not visualized. Vision acuity cannot be reliably tested. Left lower facial weakness. Mild dysarthria. Follows commands well. Tongue midline. Dense left hemiplegia with 0/5 strength and diminished on the left. Normal strength in the right. Impaired left hemibody sensation with mild neglect. Reflexes diminished on the left. Left plantar equivocal right downgoing. Gait was not tested.    ASSESSMENT/PLAN Ms. MEESHA SEK is a 83 y.o. female with history of hypertension, hyperlipidemia, coronary artery disease, spinal stenosis, and aortic stenosis presenting with speech difficulties, left sided weakness, and right gaze deviation.  She received IV t-PA Thursday 08/21/2016 at 1200.  Stroke:  Non-dominant infarct embolic secondary to unknown source  Resultant  left hemiplegia with hemisensory loss, left gaze palsy and visual field loss   MRI - Acute infarct right lenticular nucleus. 7 mm focus of hemorrhage within the infarct.  MRA - moderate disease proximal right posterior cerebral artery, right M1, and right A2.  Carotid Doppler - pending  2D Echo - EF 65-70%. No cardiac source of emboli identified.  LDL - 153  HgbA1c - pending  VTE prophylaxis - SCDs  Diet NPO time specified  aspirin 81 mg daily prior to admission, now on No antithrombotic  Patient counseled to be compliant with her antithrombotic medications  Ongoing aggressive stroke risk factor management  Therapy recommendations: pending  Disposition: Pending  Hypertension  Stable  Permissive hypertension (OK if < 220/120) but gradually normalize in 5-7 days  Long-term BP goal normotensive  Hyperlipidemia  Home meds:  No lipid lowering medications prior to admission  LDL 153, goal < 70  Start Lipitor 40 mg  daily  Continue statin at discharge    Other Stroke Risk Factors  Advanced age  Coronary artery disease   Other Active Problems  MRI - Acute infarct Rt lenticular nucleus. 7 mm focus of hemorrhage within the infarct. Will not order antiplatelets.  Repeat Head CT in AM  NPO  Angioedema after t-PA -> received Solumedrol.   Hospital day # 1  Mikey Bussing PA-C [facility] Pager [phone] 09/12/2016, 1:25 PM I have personally examined this patient, reviewed notes, independently viewed imaging studies, participated in medical decision making and plan of care.ROS completed by me personally and pertinent positives fully documented  I have made any additions or clarifications directly to the above note. Agree with note above.  She presented with a right brain subcortical large infarct and received IV tPA with some initial difficulty but subsequent worsening but no post TPA hemorrhage. Recommend close neurological monitoring and strict blood pressure control. Continue ongoing stroke workup This patient is critically ill and at significant risk of neurological worsening, death and care requires constant monitoring of vital signs, hemodynamics,respiratory and cardiac monitoring, extensive review of multiple databases, frequent neurological assessment, discussion with family, other specialists and medical decision making of high complexity.I have made any additions or clarifications directly to the above note.This critical care time does not reflect procedure time, or teaching time or supervisory time of PA/NP/Med Resident etc but could involve care discussion time.  I spent 30 minutes of neurocritical care time  in the care of  this patient.     Antony Contras, MD Medical Director [facility] Pager: [phone] 09/12/2016 1:36 PM   To contact Stroke Continuity provider, please refer to [email or web address] After hours, contact General Neurology

## 2016-09-12 NOTE — Care Management Note (Signed)
Case Management Note  Patient Details  Name: [name] Fields MRN: [id number] Date of Birth: 1925/01/23  Subjective/Objective:   Pt admitted on 09/16/2016 s/p acute ischemic stroke s/p TPA.  PTA, pt independent and living alone.                   Action/Plan: Will follow for discharge planning as pt progresses.  PT/OT evaluations pending medical stability.    Expected Discharge Date:                  Expected Discharge Plan:  IP Rehab Facility  In-House Referral:     Discharge planning Services  CM Consult  Post Acute Care Choice:    Choice offered to:     DME Arranged:    DME Agency:     HH Arranged:    HH Agency:     Status of Service:  In process, will continue to follow  If discussed at Long Length of Stay Meetings, dates discussed:    Additional Comments:  Glennon Macmerson, Terisa Belardo M, RN 09/12/2016, 4:52 PM

## 2016-09-12 NOTE — Progress Notes (Signed)
OT Cancellation Note  Patient Details Name: [name] Fields MRN: [id number] DOB: 12-14-1924   Cancelled Treatment:    Reason Eval/Treat Not Completed: Patient not medically ready (BR)  [name], Kysa Calais B  [phone] 09/12/2016, 7:26 AM

## 2016-09-13 ENCOUNTER — Inpatient Hospital Stay (HOSPITAL_COMMUNITY): Payer: Medicare Other

## 2016-09-13 DIAGNOSIS — R1314 Dysphagia, pharyngoesophageal phase: Secondary | ICD-10-CM

## 2016-09-13 DIAGNOSIS — E785 Hyperlipidemia, unspecified: Secondary | ICD-10-CM

## 2016-09-13 DIAGNOSIS — I639 Cerebral infarction, unspecified: Secondary | ICD-10-CM

## 2016-09-13 DIAGNOSIS — Z888 Allergy status to other drugs, medicaments and biological substances status: Secondary | ICD-10-CM

## 2016-09-13 LAB — HEMOGLOBIN A1C
Hgb A1c MFr Bld: 5.6 % (ref 4.8–5.6)
Mean Plasma Glucose: 114 mg/dL

## 2016-09-13 MED ORDER — ENOXAPARIN SODIUM 30 MG/0.3ML ~~LOC~~ SOLN
30.0000 mg | SUBCUTANEOUS | Status: DC
  Administered 2016-09-13 – 2016-09-15 (×3): 30 mg via SUBCUTANEOUS
  Filled 2016-09-13 (×3): qty 0.3

## 2016-09-13 MED ORDER — IOPAMIDOL (ISOVUE-370) INJECTION 76%
INTRAVENOUS | Status: AC
Start: 2016-09-13 — End: 2016-09-13
  Administered 2016-09-13: 50 mL
  Filled 2016-09-13: qty 50

## 2016-09-13 NOTE — Progress Notes (Signed)
VASCULAR LAB PRELIMINARY  PRELIMINARY  PRELIMINARY  PRELIMINARY  Bilateral lower extremity venous duplex completed.    Preliminary report:  There is no obvious evidence of DVT or SVT noted in the visualized veins of the bilateral lower extremities.   Edis Huish, RVT 09/13/2016, 7:08 PM

## 2016-09-13 NOTE — Progress Notes (Signed)
Speech Language Pathology Treatment: Dysphagia  Patient Details Name: [name] Fields Diluzio MRN: [id number] DOB: 25-Feb-1925 Today's Date: 09/13/2016 Time: [id number] SLP Time Calculation (min) (ACUTE ONLY): 17 min  Assessment / Plan / Recommendation Clinical Impression  Pt seen for skilled observation of po intake/trials d/t nursing and MD stating pt has had "significant gains" since yesterday afternoon when MBS was completed.  SLP reviewed MBS results with family and pt and explained severity of dyphagia/implications with po intake; ice chips yielded a delayed throat clear/cough and wet vocal quality suggesting impairments with oropharyngeal swallow persist; recommend continue NPO with nutrition being provided via non-oral means at this time d/t severe risk for aspiration. Pt/family in agreement given significant risk for aspiration.  Pt oriented to self, place and situation today; able to state month, but not year with minimal verbal cues. Left inattention/right gaze preference persist, but improved with minimal verbal cueing to left side.  Moderate dysarthria present as well, so pt given compensatory strategies for speech including increasing vocal intensity, slow rate and over-articulating words/sounds within phrases/sentences.  Pharyngeal exercises completed including effortful swallow (x10) and Masako technique introduced with pt/family.  Provided family/pt with hand-out of pharyngeal exercises to complete 3x/day as able.  ST will continue to f/u for po readiness and speech/cognitive deficits while in house.   HPI HPI: 83-yo RH woman who is brought to the ED with difficulty speaking. She developed left-sided weakness and R gfaze deviation with symptoms fluctuating until after she arrived in the ED. CT Head showed no acute changes, and tPA was initiated. Pt had left-sided angioedema of the lip and tongue that increased during administration. MRI showed acute infarct right lenticular nucleus with 7 mm  focus of hemorrhage within the infarct.      SLP Plan  Continue with current plan of care     Recommendations  Diet recommendations: NPO Medication Administration: Via alternative means                General recommendations: Rehab consult Oral Care Recommendations: Oral care QID Follow up Recommendations: Inpatient Rehab Plan: Continue with current plan of care                       Laden Fieldhouse,PAT, M.S., CCC-SLP 09/13/2016, 2:00 PM

## 2016-09-13 NOTE — Evaluation (Signed)
Physical Therapy Evaluation Patient Details Name: [name] Fields MRN: [id number] DOB: 02/12/1925 Today's Date: 09/13/2016   History of Present Illness  83 y.o. female with history of hypertension, hyperlipidemia, coronary artery disease, spinal stenosis, and aortic stenosis presenting with speech difficulties, left sided weakness, and right gaze deviation.  She received IV t-PA on 08/26/2016 at 1200  Clinical Impression  Patient demonstrates deficits in functional mobility as indicated below. Will need continued skilled PT to address deficits and maximize function. Will see as indicated and progress as tolerated. Anticipate patient will need comprehensive therapies upon acute discharge, recommend CIR consult.  BP stable prior to activity 150s/60s    Follow Up Recommendations CIR;Supervision/Assistance - 24 hour    Equipment Recommendations   (TBD)    Recommendations for Other Services Rehab consult     Precautions / Restrictions Precautions Precautions: Fall Precaution Comments: watch BP Restrictions Weight Bearing Restrictions: No      Mobility  Bed Mobility Overal bed mobility: Needs Assistance Bed Mobility: Supine to Sit;Sit to Supine     Supine to sit: Max assist;+2 for physical assistance Sit to supine: Mod assist   General bed mobility comments: patient able to move RLE but continues to required cues, increase assist to helicopter patient around to EOB. Manual assist to place LUE for support when sitting, assist to elevate left side upon return to supine.  Transfers Overall transfer level: Needs assistance Equipment used: 2 person hand held assist Transfers: Sit to/from Stand Sit to Stand: Max assist;+2 physical assistance         General transfer comment: +2 max assist to power up to standing, LLE locked in rigid extension beneficial for power up to standing. Manual assist to create knee flexion upon return to sitting  Ambulation/Gait              General Gait Details: not attempted  Stairs            Wheelchair Mobility    Modified Rankin (Stroke Patients Only) Modified Rankin (Stroke Patients Only) Pre-Morbid Rankin Score: No symptoms Modified Rankin: Severe disability     Balance Overall balance assessment: Needs assistance Sitting-balance support: Feet supported;Bilateral upper extremity supported Sitting balance-Leahy Scale: Fair Sitting balance - Comments: able to maintain sitting balance at EOB, recognized UE support to be helpful   Standing balance support: During functional activity Standing balance-Leahy Scale: Zero                               Pertinent Vitals/Pain Pain Assessment: No/denies pain    Home Living Family/patient expects to be discharged to:: Private residence Living Arrangements: Alone Available Help at Discharge: Family;Friend(s) Type of Home: House Home Access: Level entry     Home Layout: One level        Prior Function           Comments: difficult to obtain PLOF, but per grandchildren, she was faily independent      Hand Dominance   Dominant Hand: Right    Extremity/Trunk Assessment   Upper Extremity Assessment Upper Extremity Assessment: LUE deficits/detail LUE Deficits / Details: rigid spasticity noted RUE, no active volitional movement noted LUE Sensation: decreased light touch;decreased proprioception LUE Coordination: decreased fine motor;decreased gross motor    Lower Extremity Assessment Lower Extremity Assessment: LLE deficits/detail LLE Deficits / Details: LLE spasticity noted, trace volitional movement with multiple cues to prompt LLE LLE Sensation: decreased light touch;decreased proprioception LLE Coordination:  decreased gross motor;decreased fine motor       Communication   Communication: No difficulties  Cognition Arousal/Alertness: Awake/alert Behavior During Therapy: WFL for tasks assessed/performed Overall Cognitive  Status: Impaired/Different from baseline Area of Impairment: Attention;Memory;Following commands;Awareness;Safety/judgement;Orientation;Problem solving Orientation Level: Disoriented to;Time Current Attention Level: Sustained Memory: Decreased short-term memory Following Commands: Follows one step commands inconsistently;Follows one step commands with increased time Safety/Judgement: Decreased awareness of safety;Decreased awareness of deficits Awareness: Intellectual Problem Solving: Slow processing;Decreased initiation;Difficulty sequencing;Requires verbal cues;Requires tactile cues      General Comments General comments (skin integrity, edema, etc.): significant inattention/neglect to left side. Educated family [id number] from the left side as well as positioning for edema control and PROM activities    Exercises     Assessment/Plan    PT Assessment Patient needs continued PT services  PT Problem List Decreased strength;Decreased activity tolerance;Decreased range of motion;Decreased balance;Decreased mobility;Decreased coordination;Decreased cognition;Decreased safety awareness;Impaired tone          PT Treatment Interventions DME instruction;Gait training;Functional mobility training;Therapeutic activities;Therapeutic exercise;Balance training;Neuromuscular re-education;Cognitive remediation;Patient/family education;Modalities    PT Goals (Current goals can be found in the Care Plan section)  Acute Rehab PT Goals Patient Stated Goal: none stated PT Goal Formulation: With patient Time For Goal Achievement: 09/27/16 Potential to Achieve Goals: Fair    Frequency Min 4X/week   Barriers to discharge        Co-evaluation               End of Session   Activity Tolerance: Patient tolerated treatment well Patient left: in bed;with call bell/phone within reach;with bed alarm set;with family/visitor present Nurse Communication: Mobility status         Time:  1340-1404 PT Time Calculation (min) (ACUTE ONLY): 24 min   Charges:   PT Evaluation $PT Eval Moderate Complexity: 1 Procedure PT Treatments $Therapeutic Activity: 8-22 mins   PT G Codes:        Fabio Asa Sep 27, 2016, 4:01 PM Charlotte Crumb, PT DPT  [phone]

## 2016-09-13 NOTE — Progress Notes (Signed)
SUBJECTIVE (INTERVAL HISTORY) Son and daughter are at bedside. Pt more awake and alert and follows commands, still dysarthria and did not pass swallow. Will need NG tube. Still has left hemiplegia and right gaze preference. Family denies pt complaining of heart palpitation but pt admitted heart palpitation feeling. She had some heart issue 10 years ago but generally she was healthy and lived independent as per family.    OBJECTIVE Temp:  [94.5 F (34.7 C)-99.9 F (37.7 C)] 98.6 F (37 C) (01/27 0600) Pulse Rate:  [73-107] 98 (01/27 0600) Cardiac Rhythm: Normal sinus rhythm (01/26 2000) Resp:  [13-26] 21 (01/27 0600) BP: (139-173)/(58-102) 166/75 (01/27 0600) SpO2:  [90 %-98 %] 97 % (01/27 0600)  CBC:   Recent Labs Lab 09/07/2016 1123 09/09/2016 1132  WBC 8.9  --   NEUTROABS 6.5  --   HGB 12.4 13.3  HCT 38.4 39.0  MCV 84.4  --   PLT 270  --     Basic Metabolic Panel:   Recent Labs Lab 09/16/2016 1123 08/21/2016 1132  NA 140 140  K 4.2 4.8  CL 109 108  CO2 22  --   GLUCOSE 108* 104*  BUN 11 17  CREATININE 0.72 0.70  CALCIUM 8.8*  --     Lipid Panel:     Component Value Date/Time   CHOL 217 (H) 09/12/2016 0324   TRIG 76 09/12/2016 0324   HDL 49 09/12/2016 0324   CHOLHDL 4.4 09/12/2016 0324   VLDL 15 09/12/2016 0324   LDLCALC 153 (H) 09/12/2016 0324   HgbA1c:  Lab Results  Component Value Date   HGBA1C 5.6 09/12/2016   Urine Drug Screen: No results found for: LABOPIA, COCAINSCRNUR, LABBENZ, AMPHETMU, THCU, LABBARB    IMAGING I have personally reviewed the radiological images below and agree with the radiology interpretations.  CTA Head and Neck 09/13/2016  CTA NECK  1. Mild for age atheromatous plaque about the carotid bifurcations without flow-limiting stenosis. 2. Subtle intimal changes with beading at the distal right ICA, suggesting mild FMD. No associated significant stenosis or other intraluminal abnormality identified. 3. Short-segment atheromatous  moderate stenosis at the origin of the left vertebral artery. Vertebral arteries otherwise widely patent within the neck. 4. Short-segment moderate stenosis of approximately 50% at the proximal right subclavian artery.  CTA HEAD  1. Continued interval evolution of right lenticular nucleus infarct. Small 4 mm focus of associated hemorrhage unchanged. 2. Moderate atheromatous disease involving the right anterior and cerebral arteries, with several notable stenoses involving the right A2 and proximal M2 branches as above. 3. Moderate atheromatous disease involving the PCAs bilaterally. Overall, disease burden is more severe within the left PCA, although there is a short-segment moderate proximal right P1 stenosis.  Ct Head Wo Contrast 09/12/2016 1. Evolving acute ischemic infarct within the right basal ganglia without significant mass effect.  Faint 3 mm hyperdensity within this region may reflect a small focus of petechial hemorrhage versus artifact.  Attention at follow-up recommended. No frank hemorrhagic transformation.  2. Otherwise stable appearance of the brain.   Ct Cervical Spine Wo Contrast 08/31/2016 No evidence of acute cervical spine injury.   Ct Head Code Stroke W/o Cm 09/14/2016 1. Stable noncontrast CT appearance of the brain since 2016. No acute cortically based infarct or acute intracranial hemorrhage identified.  2. ASPECTS is 10.   Ct Head Wo Contrast - post t-PA  09/12/2016 1. Evolving acute ischemic infarct within the right basal ganglia without significant mass effect.  Faint 3  mm hyperdensity within this region may reflect a small focus of petechial hemorrhage versus artifact.  Attention at follow-up recommended. No frank hemorrhagic transformation. 2. Otherwise stable appearance of the brain.  MRI / MRA Brain Wo Contrast  09/12/2016 MRI Acute infarct right lenticular nucleus. 7 mm focus of hemorrhage within the infarct. Atrophy with chronic microvascular  ischemia  MRA  Images degraded by motion and intracranial vessel tortuosity. Moderate focal stenosis proximal right posterior cerebral artery. Focal signal loss right M1 segment likely due to tortuosity. Moderate stenosis right A2 segment.  Transthoracic Echocardiogram 07/12/2016 Study Conclusions - Left ventricle: The cavity size was normal. There was moderate   concentric hypertrophy. Systolic function was vigorous. The   estimated ejection fraction was in the range of 65% to 70%. Wall   motion was normal; there were no regional wall motion   abnormalities. Doppler parameters are consistent with abnormal   left ventricular relaxation (grade 1 diastolic dysfunction).   Doppler parameters are consistent with elevated ventricular   end-diastolic filling pressure. - Aortic valve: Trileaflet; normal thickness leaflets. There was   mild regurgitation. - Mitral valve: Calcified annulus. There was mild regurgitation. - Left atrium: The atrium was normal in size. - Right ventricle: Systolic function was normal. - Tricuspid valve: There was no regurgitation. - Pulmonary arteries: Systolic pressure could not be accurately   estimated. - Inferior vena cava: The vessel was normal in size. - Pericardium, extracardiac: There was no pericardial effusion. Impressions: No cardiac source of emboli was indentified.  LE venous doppler pending   PHYSICAL EXAM  Temp:  [94.5 F (34.7 C)-99.7 F (37.6 C)] 99.1 F (37.3 C) (01/27 1100) Pulse Rate:  [73-107] 93 (01/27 1100) Resp:  [13-26] 15 (01/27 1100) BP: (139-173)/(58-98) 145/71 (01/27 1100) SpO2:  [91 %-98 %] 96 % (01/27 1100)  General - Well nourished, well developed, in no apparent distress, mildly lethargic.  Ophthalmologic - Fundi not visualized due to noncooperation.  Cardiovascular - Regular rate and rhythm.  Mental Status -  Level of arousal and orientation to place, and person were intact, but not to time. Language including  expression, naming, repetition, comprehension was assessed and found intact, but moderate dysarthria. Left neglect.   Cranial Nerves II - XII - II - left visual neglect. III, IV, VI - right gaze preference, barely cross midline. V - Facial sensation intact bilaterally. VII - left facial droop. VIII - Hearing & vestibular intact bilaterally. X - Palate elevates symmetrically, moderate dysarthria. XI - Chin turning & shoulder shrug intact bilaterally. XII - Tongue protrusion intact.  Motor Strength - The patient's strength was RUE and RLE at least 4/5, but LUE 0/5 and LLE 1/5. Bulk was normal and fasciculations were absent.   Motor Tone - Muscle tone was assessed at the neck and appendages and was significant increase on the left UE and LE.  Reflexes - The patient's reflexes were 1+ in all extremities and she had positive left babinski.  Sensory - Light touch, temperature/pinprick were assessed and were symmetrical.    Coordination - The patient had normal movements in the right hand with no ataxia or dysmetria.  Tremor was absent.  Gait and Station - not able to test.   ASSESSMENT/PLAN [name] is a 83 y.o. female with history of hypertension, hyperlipidemia, coronary artery disease, spinal stenosis, and aortic stenosis presenting with speech difficulties, left sided weakness, and right gaze deviation.  She received IV t-PA on 10/06/2016 at 1200.  Stroke:  Right  BG/CR large infarct, more embolic pattern secondary to unknown source  Resultant  left hemiplegia, left gaze palsy and left neglect   MRI - Acute large infarct right BG and CR with small hemorrhagic transformation.  CTA H&N - right M1, and right A2 high grade stenosis, left CCA soft plaque vs. Thrombus, aortic atherosclerosis  Carotid Doppler - unremarkable  2D Echo - EF 65-70%. No cardiac source of emboli identified.  LE venous doppler - pending  Consider TEE and loop record next week   LDL -  153  HgbA1c - 5.6  VTE prophylaxis - lovenox Diet NPO time specified  aspirin 81 mg daily prior to admission, now on aspirin suppository 300 mg daily. May consider DAPT in a couple of days due to hemorrhagic transformation.  Patient counseled to be compliant with her antithrombotic medications  Ongoing aggressive stroke risk factor management  Therapy recommendations: pending  Disposition: Pending  Hypertension  stable  Permissive hypertension (OK if < 180/105) but gradually normalize in 5-7 days  BP goal 120-150 due to left MCA high grade stenosis  Hyperlipidemia  Home meds:  No lipid lowering medications prior to admission  LDL 153, goal < 70  Start Lipitor 40 mg daily  Continue statin at discharge  Dysphagia  Did not pass swallow  NPO  Need NG tube for nutrition  Speech following  Other Stroke Risk Factors  Advanced age  Coronary artery disease   Other Active Problems  Angioedema after t-PA -> received Solumedrol - will d/c    Hospital day # 2  This patient is critically ill due to right MCA infarct s/p tPA, HTN, dysphagia, angioedema s/p tPA and at significant risk of neurological worsening, death form anaphylaxis, recurrent stroke, hemorrhagic transformation, heart failure, aspiration pneumonia. This patient's care requires constant monitoring of vital signs, hemodynamics, respiratory and cardiac monitoring, review of multiple databases, neurological assessment, discussion with family, other specialists and medical decision making of high complexity. I had long discussion with son and daughter at beside, updated them pt current condition, etiology of stroke, current treatment options and further work up. I spent 40 minutes of neurocritical care time in the care of this patient.  [name] PlanJindong Daniyal Fields, [name] Fields Stroke Neurology 09/13/2016 2:04 PM   To contact Stroke Continuity provider, please refer to [email or web address]. After hours, contact General Neurology

## 2016-09-13 NOTE — Progress Notes (Signed)
Inpatient Rehabilitation  PT and SLP have evaluated pt. and are recommending IP Rehab Patient was screened by Rondall Radigan for appropriatenesWeldon Fields for an Inpatient Acute Rehab consult.  At this time, we are recommending Inpatient Rehab consult.    [name] PickingSusan Sukhraj Esquivias PT Inpatient Rehab Admissions Coordinator Cell [phone] Office [phone]

## 2016-09-13 NOTE — Progress Notes (Signed)
Pt admitted to the unit as transfer from [id number]. Pt alert and verbally responsive; pt and family oriented to the unit; fall precaution and prevention education completed. IV intact and transfusing; pt voided incontinently. Pt skin dry and intact with no pressure ulcer or wounds except for bilateral arms ecchymosis. Panda intact to right nare capped. VSS; telemetry applied and verified with CCMD; NT called to second verify. Call light within reach and bed alarm on. Will continue to closely monitor. Dionne BucyP. Amo Leyani Gargus RN

## 2016-09-14 ENCOUNTER — Inpatient Hospital Stay (HOSPITAL_COMMUNITY): Payer: Medicare Other

## 2016-09-14 DIAGNOSIS — I63411 Cerebral infarction due to embolism of right middle cerebral artery: Secondary | ICD-10-CM

## 2016-09-14 LAB — CBC
HEMATOCRIT: 34.7 % — AB (ref 36.0–46.0)
HEMOGLOBIN: 12.1 g/dL (ref 12.0–15.0)
MCH: 29.4 pg (ref 26.0–34.0)
MCHC: 34.9 g/dL (ref 30.0–36.0)
MCV: 84.2 fL (ref 78.0–100.0)
RBC: 4.12 MIL/uL (ref 3.87–5.11)
RDW: 14.2 % (ref 11.5–15.5)
WBC: 14 10*3/uL — AB (ref 4.0–10.5)

## 2016-09-14 LAB — BASIC METABOLIC PANEL
Anion gap: 12 (ref 5–15)
BUN: 14 mg/dL (ref 6–20)
CHLORIDE: 105 mmol/L (ref 101–111)
CO2: 19 mmol/L — ABNORMAL LOW (ref 22–32)
Calcium: 8.2 mg/dL — ABNORMAL LOW (ref 8.9–10.3)
Creatinine, Ser: 0.61 mg/dL (ref 0.44–1.00)
GFR calc Af Amer: >60 mL/min (ref >60)
GFR calc non Af Amer: >60 mL/min (ref >60)
Glucose, Bld: 101 mg/dL — ABNORMAL HIGH (ref 65–99)
Potassium: 2.8 mmol/L — ABNORMAL LOW (ref 3.5–5.1)
SODIUM: 136 mmol/L (ref 135–145)

## 2016-09-14 LAB — GLUCOSE, CAPILLARY
GLUCOSE-CAPILLARY: 92 mg/dL (ref 65–99)
GLUCOSE-CAPILLARY: 98 mg/dL (ref 65–99)

## 2016-09-14 LAB — TSH: TSH: 1.938 u[IU]/mL (ref 0.350–4.500)

## 2016-09-14 LAB — VITAMIN B12: VITAMIN B 12: 179 pg/mL — AB (ref 180–914)

## 2016-09-14 MED ORDER — JEVITY 1.2 CAL PO LIQD
1000.0000 mL | ORAL | Status: DC
  Administered 2016-09-14: 1000 mL
  Filled 2016-09-14 (×4): qty 1000

## 2016-09-14 NOTE — Progress Notes (Signed)
SUBJECTIVE (INTERVAL HISTORY) Son and daughter are at bedside. Pt more awake and alert and follows commands, still dysarthria but right gaze preference much improved, able to move to the left side, although not complete. Had a G-tube and will start to feeding. Schedule for TEE and recorder tomorrow .   OBJECTIVE Temp:  [98.6 F (37 C)-99.7 F (37.6 C)] 98.6 F (37 C) (01/28 0452) Pulse Rate:  [89-103] 99 (01/28 0452) Cardiac Rhythm: Normal sinus rhythm (01/28 0700) Resp:  [15-21] 20 (01/28 0452) BP: (138-176)/(59-86) 138/86 (01/28 0452) SpO2:  [90 %-98 %] 96 % (01/28 0452)  CBC:   Recent Labs Lab 09/04/2016 1123 09/07/2016 1132 09/14/16 0809  WBC 8.9  --  14.0*  NEUTROABS 6.5  --   --   HGB 12.4 13.3 12.1  HCT 38.4 39.0 34.7*  MCV 84.4  --  84.2  PLT 270  --  PENDING    Basic Metabolic Panel:   Recent Labs Lab 09/12/2016 1123 08/24/2016 1132  NA 140 140  K 4.2 4.8  CL 109 108  CO2 22  --   GLUCOSE 108* 104*  BUN 11 17  CREATININE 0.72 0.70  CALCIUM 8.8*  --     Lipid Panel:     Component Value Date/Time   CHOL 217 (H) 09/12/2016 0324   TRIG 76 09/12/2016 0324   HDL 49 09/12/2016 0324   CHOLHDL 4.4 09/12/2016 0324   VLDL 15 09/12/2016 0324   LDLCALC 153 (H) 09/12/2016 0324   HgbA1c:  Lab Results  Component Value Date   HGBA1C 5.6 09/12/2016   Urine Drug Screen: No results found for: LABOPIA, COCAINSCRNUR, LABBENZ, AMPHETMU, THCU, LABBARB    IMAGING I have personally reviewed the radiological images below and agree with the radiology interpretations.  CTA Head and Neck 09/13/2016  CTA NECK  1. Mild for age atheromatous plaque about the carotid bifurcations without flow-limiting stenosis. 2. Subtle intimal changes with beading at the distal right ICA, suggesting mild FMD. No associated significant stenosis or other intraluminal abnormality identified. 3. Short-segment atheromatous moderate stenosis at the origin of the left vertebral artery. Vertebral  arteries otherwise widely patent within the neck. 4. Short-segment moderate stenosis of approximately 50% at the proximal right subclavian artery.  CTA HEAD  1. Continued interval evolution of right lenticular nucleus infarct. Small 4 mm focus of associated hemorrhage unchanged. 2. Moderate atheromatous disease involving the right anterior and cerebral arteries, with several notable stenoses involving the right A2 and proximal M2 branches as above. 3. Moderate atheromatous disease involving the PCAs bilaterally. Overall, disease burden is more severe within the left PCA, although there is a short-segment moderate proximal right P1 stenosis.  Ct Head Wo Contrast 09/12/2016 1. Evolving acute ischemic infarct within the right basal ganglia without significant mass effect.  Faint 3 mm hyperdensity within this region may reflect a small focus of petechial hemorrhage versus artifact.  Attention at follow-up recommended. No frank hemorrhagic transformation.  2. Otherwise stable appearance of the brain.   Ct Cervical Spine Wo Contrast 09/15/2016 No evidence of acute cervical spine injury.   Ct Head Code Stroke W/o Cm 08/23/2016 1. Stable noncontrast CT appearance of the brain since 2016. No acute cortically based infarct or acute intracranial hemorrhage identified.  2. ASPECTS is 10.   Ct Head Wo Contrast - post t-PA  09/12/2016 1. Evolving acute ischemic infarct within the right basal ganglia without significant mass effect.  Faint 3 mm hyperdensity within this region may reflect a small  focus of petechial hemorrhage versus artifact.  Attention at follow-up recommended. No frank hemorrhagic transformation. 2. Otherwise stable appearance of the brain.  MRI / MRA Brain Wo Contrast  09/12/2016 MRI Acute infarct right lenticular nucleus. 7 mm focus of hemorrhage within the infarct. Atrophy with chronic microvascular ischemia  MRA  Images degraded by motion and intracranial vessel tortuosity.  Moderate focal stenosis proximal right posterior cerebral artery. Focal signal loss right M1 segment likely due to tortuosity. Moderate stenosis right A2 segment.  Transthoracic Echocardiogram 07/12/2016 Study Conclusions - Left ventricle: The cavity size was normal. There was moderate   concentric hypertrophy. Systolic function was vigorous. The   estimated ejection fraction was in the range of 65% to 70%. Wall   motion was normal; there were no regional wall motion   abnormalities. Doppler parameters are consistent with abnormal   left ventricular relaxation (grade 1 diastolic dysfunction).   Doppler parameters are consistent with elevated ventricular   end-diastolic filling pressure. - Aortic valve: Trileaflet; normal thickness leaflets. There was   mild regurgitation. - Mitral valve: Calcified annulus. There was mild regurgitation. - Left atrium: The atrium was normal in size. - Right ventricle: Systolic function was normal. - Tricuspid valve: There was no regurgitation. - Pulmonary arteries: Systolic pressure could not be accurately   estimated. - Inferior vena cava: The vessel was normal in size. - Pericardium, extracardiac: There was no pericardial effusion. Impressions: No cardiac source of emboli was indentified.  LE venous doppler pending 09/13/2016 No obvious evidence of deep vein thrombosis involving the visualized veins of the bilateral lower extremities.  Carotid Ultrasound 09/12/2016 Bilateral 1-39% ICA stenosis, antegrade vertebral flow.   PHYSICAL EXAM  Temp:  [98.6 F (37 C)-99.7 F (37.6 C)] 98.6 F (37 C) (01/28 0452) Pulse Rate:  [89-103] 99 (01/28 0452) Resp:  [15-21] 20 (01/28 0452) BP: (138-176)/(59-86) 138/86 (01/28 0452) SpO2:  [90 %-98 %] 96 % (01/28 0452)  General - Well nourished, well developed, in no apparent distress, mildly lethargic.  Ophthalmologic - Fundi not visualized due to noncooperation.  Cardiovascular - Regular rate and  rhythm.  Mental Status -  Level of arousal and orientation to place, and person were intact, but not to time. Language including expression, naming, repetition, comprehension was assessed and found intact, but moderate dysarthria.   Cranial Nerves II - XII - II - visual field grossly intact. III, IV, VI - right gaze preference, but able to cross midline. V - Facial sensation intact bilaterally. VII - left facial droop. VIII - Hearing & vestibular intact bilaterally. X - Palate elevates symmetrically, moderate dysarthria. XI - Chin turning & shoulder shrug intact bilaterally. XII - Tongue protrusion intact.  Motor Strength - The patient's strength was RUE and RLE at least 4/5, but LUE 1/5 and LLE 1/5. Bulk was normal and fasciculations were absent.   Motor Tone - Muscle tone was assessed at the neck and appendages and was increased on the left UE and LE.  Reflexes - The patient's reflexes were 1+ in all extremities and she had positive left babinski.  Sensory - Light touch, temperature/pinprick were assessed and were symmetrical.    Coordination - The patient had normal movements in the right hand with no ataxia or dysmetria.  Tremor was absent.  Gait and Station - not able to test.   ASSESSMENT/PLAN Ms. DANNIA SNOOK is a 83 y.o. female with history of hypertension, hyperlipidemia, coronary artery disease, spinal stenosis, and aortic stenosis presenting with speech difficulties, left  sided weakness, and right gaze deviation.  She received IV t-PA on 09/03/2016 at 1200.  Stroke:  Right BG/CR large infarct, more embolic pattern secondary to unknown source  Resultant  left hemiplegia, left gaze palsy and left neglect   MRI - Acute large infarct right BG and CR with small hemorrhagic transformation.  CTA H&N - right M1, and right A2 high grade stenosis, left CCA soft plaque vs. Thrombus, aortic atherosclerosis  Carotid Doppler - unremarkable  2D Echo - EF 65-70%. No cardiac  source of emboli identified.  LE venous doppler - negative for DVT  TEE and loop recorder Monday   LDL - 153  HgbA1c - 5.6  VTE prophylaxis - lovenox Diet NPO time specified  aspirin 81 mg daily prior to admission, now on aspirin suppository 300 mg daily. Hold off DAPT due to hemorrhagic transformation and the potential surgical procedure.  Patient counseled to be compliant with her antithrombotic medications  Ongoing aggressive stroke risk factor management  Therapy recommendations: Possible CIR -  MD consult - pending  Disposition: Pending  Hypertension  Stable  Permissive hypertension (OK if < 180/105) but gradually normalize in 5-7 days  BP goal 120-150 due to left MCA high grade stenosis  Hyperlipidemia  Home meds:  No lipid lowering medications prior to admission  LDL 153, goal < 70  Start Lipitor 40 mg daily  Continue statin at discharge  Dysphagia  Did not pass swallow  NPO  has NG tube for nutrition  Speech following  Other Stroke Risk Factors  Advanced age  Coronary artery disease  Other Active Problems  Angioedema after t-PA -> received Solumedrol - will d/c    Hospital day # 3  Marvel Plan, MD PhD Stroke Neurology 09/14/2016 6:14 PM    To contact Stroke Continuity provider, please refer to [email or web address]. After hours, contact General Neurology

## 2016-09-14 NOTE — Progress Notes (Signed)
Pt pulled her corktrak, feedings stopped and MD notified; per x-ray tech: x-ray showed panda coiled in pt throat. Panda removed from the nare; Dr. Roda ShuttersXU notified and he ordered to increase pt fluid back to 6375ml/hr and hold off feedings till tomorrow after pt's ordered TEE. Will report off to oncoming RN. Dionne BucyP. Amo Elzina Devera RN

## 2016-09-14 NOTE — Evaluation (Signed)
Occupational Therapy Evaluation Patient Details Name: Fernande BrasJacqueline N Ramsburg MRN: [id number] DOB: [birth year] Today's Date: 09/14/2016    History of Present Illness 83 y.o. female with history of hypertension, hyperlipidemia, coronary artery disease, spinal stenosis, and aortic stenosis presenting with speech difficulties, left sided weakness, and right gaze deviation.  She received IV t-PA on 09/12/2016 at 1200   Clinical Impression   Per PT report, pt was "fairly independent" PTA. She currently requires mod-max assist with ADL and presents with decreased attention, awareness, and orientation. She demonstrates inattention to the L side of her body and the L visual field. Due to significant functional decline from PLOF, pt would benefit from continued OT services while admitted to improve independence with ADL and functional mobility. Recommend CIR placement for continued rehabilitation post-acute D/C. OT will continue to follow acutely.    Follow Up Recommendations  CIR;Supervision/Assistance - 24 hour    Equipment Recommendations  Other (comment) (TBD at next venue of care)    Recommendations for Other Services       Precautions / Restrictions Precautions Precautions: Fall Precaution Comments: watch BP Restrictions Weight Bearing Restrictions: No      Mobility Bed Mobility Overal bed mobility: Needs Assistance Bed Mobility: Supine to Sit     Supine to sit: Max assist;+2 for physical assistance;Mod assist     General bed mobility comments: Physical assistance required to position L UE and to raise trunk from bed.  Transfers Overall transfer level: Needs assistance Equipment used: 1 person hand held assist Transfers: Sit to/from Stand Sit to Stand: Max assist;+2 safety/equipment         General transfer comment: Max assist to power up into standing with L LE blocked.    Balance Overall balance assessment: Needs assistance Sitting-balance support: Feet  supported;Bilateral upper extremity supported Sitting balance-Leahy Scale: Fair Sitting balance - Comments: able to maintain sitting balance at EOB, recognized UE support to be helpful Postural control: Posterior lean Standing balance support: During functional activity Standing balance-Leahy Scale: Zero                              ADL Overall ADL's : Needs assistance/impaired Eating/Feeding: NPO   Grooming: Brushing hair;Wash/dry face;Min guard;Cueing for safety;Sitting   Upper Body Bathing: Moderate assistance;Sitting   Lower Body Bathing: Maximal assistance;Sitting/lateral leans   Upper Body Dressing : Moderate assistance;Sitting   Lower Body Dressing: Maximal assistance;Sitting/lateral leans   Toilet Transfer: Maximal assistance;+2 for safety/equipment Toilet Transfer Details (indicate cue type and reason): +2 assist to manage equipment and for safety. Toileting- Clothing Manipulation and Hygiene: Maximal assistance;Sit to/from stand;+2 for safety/equipment         General ADL Comments: Pt with decreased attention to task and ability to follow commands which impacted participation with ADL. R gaze requiring max VC's to maintain midline or cross midline gaze. Only able to cross midline gaze momentarily. Significant neglect of L UE often having it pinned behind her and requiring verbal and tactile cues to correct positioning.     Vision Vision Assessment?: Yes;Vision impaired- to be further tested in functional context Ocular Range of Motion: Restricted on the left Alignment/Gaze Preference: Gaze right Tracking/Visual Pursuits: Impaired - to be further tested in functional context (B eyes with difficulty tracking to L) Saccades: Impaired - to be further tested in functional context Convergence: Impaired - to be further tested in functional context Visual Fields: Impaired-to be further tested in functional context (Difficult to assess  due to cognitive  status) Additional Comments: Difficult to assess vision due to difficutly following commands.   Perception     Praxis      Pertinent Vitals/Pain Pain Assessment: No/denies pain     Hand Dominance Right   Extremity/Trunk Assessment Upper Extremity Assessment Upper Extremity Assessment: LUE deficits/detail LUE Deficits / Details: Spasticity noted RUE, no active volitional movement noted LUE Sensation: decreased light touch;decreased proprioception LUE Coordination: decreased fine motor;decreased gross motor   Lower Extremity Assessment Lower Extremity Assessment: LLE deficits/detail LLE Deficits / Details: LLE spasticity noted, slight volitional movement with verbal and tactile cues to prompt LLE LLE Sensation: decreased light touch;decreased proprioception LLE Coordination: decreased gross motor;decreased fine motor       Communication Communication Communication: No difficulties   Cognition Arousal/Alertness: Awake/alert Behavior During Therapy: WFL for tasks assessed/performed Overall Cognitive Status: Impaired/Different from baseline Area of Impairment: Attention;Memory;Following commands;Awareness;Safety/judgement;Orientation;Problem solving Orientation Level: Disoriented to;Time;Place Current Attention Level: Sustained Memory: Decreased short-term memory Following Commands: Follows one step commands inconsistently;Follows one step commands with increased time Safety/Judgement: Decreased awareness of safety;Decreased awareness of deficits Awareness: Intellectual Problem Solving: Slow processing;Decreased initiation;Difficulty sequencing;Requires verbal cues;Requires tactile cues General Comments: Pt confused throughout session reporting that therapy tech is her caregiver at home. Disoriented to place and time.   General Comments       Exercises       Shoulder Instructions      Home Living Family/patient expects to be discharged to:: Private residence Living  Arrangements: Alone Available Help at Discharge: Family;Friend(s) Type of Home: House Home Access: Level entry     Home Layout: One level     Bathroom Shower/Tub:  (Reports she does not know)             Additional Comments: Question reliability of pt report. Pt reporting unsure of bathroom set-up.      Prior Functioning/Environment          Comments: Per PT note grandchildren report pt was fairly independent. Difficult to obtain PLOF from pt this date as she was confused.        OT Problem List: Decreased strength;Decreased range of motion;Decreased activity tolerance;Impaired balance (sitting and/or standing);Impaired vision/perception;Decreased cognition;Decreased safety awareness;Decreased knowledge of use of DME or AE;Decreased knowledge of precautions;Pain;Impaired UE functional use   OT Treatment/Interventions: Self-care/ADL training;Therapeutic exercise;Neuromuscular education;Energy conservation;DME and/or AE instruction;Therapeutic activities;Cognitive remediation/compensation;Visual/perceptual remediation/compensation;Balance training;Patient/family education    OT Goals(Current goals can be found in the care plan section) Acute Rehab OT Goals Patient Stated Goal: none stated OT Goal Formulation: With patient Time For Goal Achievement: 09/21/16 Potential to Achieve Goals: Good ADL Goals Pt Will Perform Grooming: with min assist;standing Pt Will Perform Upper Body Bathing: with min guard assist;standing Pt Will Perform Lower Body Bathing: with min assist;sit to/from stand Pt Will Transfer to Toilet: with min assist;ambulating;bedside commode Pt Will Perform Toileting - Clothing Manipulation and hygiene: with min assist;sit to/from stand Pt/caregiver will Perform Home Exercise Program: Increased ROM;Left upper extremity;With written HEP provided Additional ADL Goal #1: Pt will complete bed mobility with min guard assist in preparation for ADL tasks.  OT  Frequency: Min 3X/week   Barriers to D/C:            Co-evaluation              End of Session Equipment Utilized During Treatment: Gait belt Nurse Communication: Mobility status  Activity Tolerance: Patient tolerated treatment well Patient left: in chair;with call bell/phone within reach   Time: 0934-1001 OT  Time Calculation (min): 27 min Charges:  OT General Charges $OT Visit: 1 Procedure OT Evaluation $OT Eval Moderate Complexity: 1 Procedure OT Treatments $Self Care/Home Management : 8-22 mins G-Codes:    Doristine Section, OTR/L [phone] 09/14/2016, 11:59 AM

## 2016-09-15 ENCOUNTER — Inpatient Hospital Stay (HOSPITAL_COMMUNITY): Payer: Medicare Other

## 2016-09-15 DIAGNOSIS — R000 Tachycardia, unspecified: Secondary | ICD-10-CM

## 2016-09-15 DIAGNOSIS — I519 Heart disease, unspecified: Secondary | ICD-10-CM

## 2016-09-15 DIAGNOSIS — I350 Nonrheumatic aortic (valve) stenosis: Secondary | ICD-10-CM

## 2016-09-15 DIAGNOSIS — I5189 Other ill-defined heart diseases: Secondary | ICD-10-CM

## 2016-09-15 DIAGNOSIS — I6601 Occlusion and stenosis of right middle cerebral artery: Secondary | ICD-10-CM

## 2016-09-15 DIAGNOSIS — E538 Deficiency of other specified B group vitamins: Secondary | ICD-10-CM

## 2016-09-15 DIAGNOSIS — I639 Cerebral infarction, unspecified: Secondary | ICD-10-CM

## 2016-09-15 DIAGNOSIS — D72829 Elevated white blood cell count, unspecified: Secondary | ICD-10-CM

## 2016-09-15 DIAGNOSIS — R1310 Dysphagia, unspecified: Secondary | ICD-10-CM

## 2016-09-15 DIAGNOSIS — I2510 Atherosclerotic heart disease of native coronary artery without angina pectoris: Secondary | ICD-10-CM

## 2016-09-15 DIAGNOSIS — E782 Mixed hyperlipidemia: Secondary | ICD-10-CM

## 2016-09-15 DIAGNOSIS — I10 Essential (primary) hypertension: Secondary | ICD-10-CM

## 2016-09-15 LAB — CBC
HCT: 38.6 % (ref 36.0–46.0)
Hemoglobin: 12.8 g/dL (ref 12.0–15.0)
MCH: 27.2 pg (ref 26.0–34.0)
MCHC: 33.2 g/dL (ref 30.0–36.0)
MCV: 82.1 fL (ref 78.0–100.0)
PLATELETS: 269 10*3/uL (ref 150–400)
RBC: 4.7 MIL/uL (ref 3.87–5.11)
RDW: 13.4 % (ref 11.5–15.5)
WBC: 12.3 10*3/uL — AB (ref 4.0–10.5)

## 2016-09-15 LAB — GLUCOSE, CAPILLARY
GLUCOSE-CAPILLARY: 117 mg/dL — AB (ref 65–99)
GLUCOSE-CAPILLARY: 132 mg/dL — AB (ref 65–99)
Glucose-Capillary: 107 mg/dL — ABNORMAL HIGH (ref 65–99)

## 2016-09-15 LAB — BASIC METABOLIC PANEL
ANION GAP: 8 (ref 5–15)
BUN: 11 mg/dL (ref 6–20)
CALCIUM: 7.9 mg/dL — AB (ref 8.9–10.3)
CO2: 22 mmol/L (ref 22–32)
Chloride: 106 mmol/L (ref 101–111)
Creatinine, Ser: 0.58 mg/dL (ref 0.44–1.00)
GFR calc Af Amer: >60 mL/min (ref >60)
GLUCOSE: 96 mg/dL (ref 65–99)
Potassium: 2.4 mmol/L — CL (ref 3.5–5.1)
SODIUM: 136 mmol/L (ref 135–145)

## 2016-09-15 MED ORDER — SODIUM CHLORIDE 0.9 % IV SOLN
30.0000 meq | Freq: Once | INTRAVENOUS | Status: AC
  Administered 2016-09-15: 30 meq via INTRAVENOUS
  Filled 2016-09-15: qty 15

## 2016-09-15 MED ORDER — PRO-STAT SUGAR FREE PO LIQD
30.0000 mL | Freq: Every day | ORAL | Status: DC
  Administered 2016-09-15: 30 mL
  Filled 2016-09-15: qty 30

## 2016-09-15 MED ORDER — KCL IN DEXTROSE-NACL 40-5-0.9 MEQ/L-%-% IV SOLN
INTRAVENOUS | Status: AC
  Administered 2016-09-15: 1000 mL via INTRAVENOUS
  Filled 2016-09-15 (×2): qty 1000

## 2016-09-15 NOTE — Care Management Important Message (Signed)
Important Message  Patient Details  Name: [name] Fields MRN: [id number] Date of Birth: 08/27/1924   Medicare Important Message Given:  Yes    Lylith Bebeau 09/15/2016, 1:44 PM

## 2016-09-15 NOTE — Progress Notes (Addendum)
Inpatient Rehabilitation  I met with the patient and her daughter in law Opal Sidles at the bedside to discuss the recommendation for IP Rehab.  I shared informational booklets and answered their questions.  They would like me to pursue authorization from [facility] for an [place] admission.  I will initiate that process.  Note pt. is for TEE/loor recorder tomorrow.  Additionally, need a PT evaluation to send [facility] Medicare.  Please call if questions.  [place] Admissions Coordinator Cell [phone] Office [phone]   Addendum [phone]:  Daughter in law Opal Sidles states that pt's son (and her husband) is an only child.  They are committed to caring for pt. In the home enviornment following an IP Rehab stay and will hire help as needed.   Gerlean Ren

## 2016-09-15 NOTE — Progress Notes (Signed)
Initial Nutrition Assessment  DOCUMENTATION CODES:      INTERVENTION: Tube Feeding  Provide Jevity 1.2 via NG tube @ goal rate of 50 ml/hour provides 1440 kcal and 67 grams protein. 1 Prostat at 15 grams protein and 100 kcal for a total of 1540 kcal and 82 grams protein.    NUTRITION DIAGNOSIS:   Inadequate oral intake related to acute illness as evidenced by NPO status.    GOAL:   Patient will meet greater than or equal to 90% of their needs    MONITOR:   PO intake, Labs, Weight trends  REASON FOR ASSESSMENT:   Consult Enteral/tube feeding initiation and management  ASSESSMENT:   Per patient report, she has not eaten for 2 days. When asked, she did not report any weight loss, but did report having abdominal pain. Patient stated she was eating well at home PTA and reported eating 3-5 meals per day. Patient is currently NPO and per chart, pt pulled cortrack 1/28. NFPE indicated patient was well nourished, but some swelling was present in lower extremities.   Labs Reviewed: CBG 107, Potassium 2.4, Calcium 7.9. Meds Reviewed: Lipitor, Pepcid, Protonix    Diet Order:  Diet NPO time specified  Skin:  Reviewed, no issues  Last BM:  1/28  Height:   Ht Readings from Last 1 Encounters:  09/16/2016 5' (1.524 m)    Weight:   Wt Readings from Last 1 Encounters:  09/15/16 133 lb 4.8 oz (60.5 kg)    Ideal Body Weight:  45.45 kg  BMI:  Body mass index is 26.03 kg/m.  Estimated Nutritional Needs:   Kcal:  1400-1550 (23-25 kcal/kg)  Protein:  60-73 grams (1-1.2 g/kg)  Fluid:  1.4-1.5 L/day  EDUCATION NEEDS:   No education needs identified at this time  Rex KrasGrace Ann Marleny Faller M.S. Nutrition Dietetic Intern

## 2016-09-15 NOTE — Progress Notes (Signed)
SUBJECTIVE (INTERVAL HISTORY) No family is at bedside. Pt is sitting in chair, still dysarthria but right gaze preference much improved. Did not pass swallow again and NG tube was pulled off last night. Will need re-inserted and continue TF. Schedule for TEE tomorrow. EP recommend 30 day cardiac event monitoring.   OBJECTIVE Temp:  [98.8 F (37.1 C)-99.2 F (37.3 C)] 98.8 F (37.1 C) (01/29 1029) Pulse Rate:  [95-112] 109 (01/29 1029) Cardiac Rhythm: Normal sinus rhythm (01/28 2000) Resp:  [19-22] 19 (01/29 1029) BP: (141-180)/(53-78) 156/74 (01/29 1029) SpO2:  [96 %-98 %] 97 % (01/29 1029) Weight:  [133 lb 4.8 oz (60.5 kg)] 133 lb 4.8 oz (60.5 kg) (01/29 0500)  CBC:   Recent Labs Lab 09/04/2016 1123  09/14/16 0809 09/15/16 0143  WBC 8.9  --  14.0* 12.3*  NEUTROABS 6.5  --   --   --   HGB 12.4  < > 12.1 12.8  HCT 38.4  < > 34.7* 38.6  MCV 84.4  --  84.2 82.1  PLT 270  --  SPECIMEN CLOTTED 269  < > = values in this interval not displayed.  Basic Metabolic Panel:   Recent Labs Lab 09/14/16 1056 09/15/16 0143  NA 136 136  K 2.8* 2.4*  CL 105 106  CO2 19* 22  GLUCOSE 101* 96  BUN 14 11  CREATININE 0.61 0.58  CALCIUM 8.2* 7.9*    Lipid Panel:     Component Value Date/Time   CHOL 217 (H) 09/12/2016 0324   TRIG 76 09/12/2016 0324   HDL 49 09/12/2016 0324   CHOLHDL 4.4 09/12/2016 0324   VLDL 15 09/12/2016 0324   LDLCALC 153 (H) 09/12/2016 0324   HgbA1c:  Lab Results  Component Value Date   HGBA1C 5.6 09/12/2016   Urine Drug Screen: No results found for: LABOPIA, COCAINSCRNUR, LABBENZ, AMPHETMU, THCU, LABBARB    IMAGING I have personally reviewed the radiological images below and agree with the radiology interpretations.  CTA Head and Neck 09/13/2016  CTA NECK  1. Mild for age atheromatous plaque about the carotid bifurcations without flow-limiting stenosis. 2. Subtle intimal changes with beading at the distal right ICA, suggesting mild FMD. No associated  significant stenosis or other intraluminal abnormality identified. 3. Short-segment atheromatous moderate stenosis at the origin of the left vertebral artery. Vertebral arteries otherwise widely patent within the neck. 4. Short-segment moderate stenosis of approximately 50% at the proximal right subclavian artery.  CTA HEAD  1. Continued interval evolution of right lenticular nucleus infarct. Small 4 mm focus of associated hemorrhage unchanged. 2. Moderate atheromatous disease involving the right anterior and cerebral arteries, with several notable stenoses involving the right A2 and proximal M2 branches as above. 3. Moderate atheromatous disease involving the PCAs bilaterally. Overall, disease burden is more severe within the left PCA, although there is a short-segment moderate proximal right P1 stenosis.  Ct Head Wo Contrast 09/12/2016 1. Evolving acute ischemic infarct within the right basal ganglia without significant mass effect.  Faint 3 mm hyperdensity within this region may reflect a small focus of petechial hemorrhage versus artifact.  Attention at follow-up recommended. No frank hemorrhagic transformation.  2. Otherwise stable appearance of the brain.   Ct Cervical Spine Wo Contrast 09/12/2016 No evidence of acute cervical spine injury.   Ct Head Code Stroke W/o Cm 09/02/2016 1. Stable noncontrast CT appearance of the brain since 2016. No acute cortically based infarct or acute intracranial hemorrhage identified.  2. ASPECTS is 10.  Ct Head Wo Contrast - post t-PA  09/12/2016 1. Evolving acute ischemic infarct within the right basal ganglia without significant mass effect.  Faint 3 mm hyperdensity within this region may reflect a small focus of petechial hemorrhage versus artifact.  Attention at follow-up recommended. No frank hemorrhagic transformation. 2. Otherwise stable appearance of the brain.  MRI / MRA Brain Wo Contrast  09/12/2016 MRI Acute infarct right lenticular  nucleus. 7 mm focus of hemorrhage within the infarct. Atrophy with chronic microvascular ischemia  MRA  Images degraded by motion and intracranial vessel tortuosity. Moderate focal stenosis proximal right posterior cerebral artery. Focal signal loss right M1 segment likely due to tortuosity. Moderate stenosis right A2 segment.  Transthoracic Echocardiogram 07/12/2016 Study Conclusions - Left ventricle: The cavity size was normal. There was moderate   concentric hypertrophy. Systolic function was vigorous. The   estimated ejection fraction was in the range of 65% to 70%. Wall   motion was normal; there were no regional wall motion   abnormalities. Doppler parameters are consistent with abnormal   left ventricular relaxation (grade 1 diastolic dysfunction).   Doppler parameters are consistent with elevated ventricular   end-diastolic filling pressure. - Aortic valve: Trileaflet; normal thickness leaflets. There was   mild regurgitation. - Mitral valve: Calcified annulus. There was mild regurgitation. - Left atrium: The atrium was normal in size. - Right ventricle: Systolic function was normal. - Tricuspid valve: There was no regurgitation. - Pulmonary arteries: Systolic pressure could not be accurately   estimated. - Inferior vena cava: The vessel was normal in size. - Pericardium, extracardiac: There was no pericardial effusion. Impressions: No cardiac source of emboli was indentified.  LE venous doppler pending 09/13/2016 No obvious evidence of deep vein thrombosis involving the visualized veins of the bilateral lower extremities.  Carotid Ultrasound 09/12/2016 Bilateral 1-39% ICA stenosis, antegrade vertebral flow.  TEE pending   PHYSICAL EXAM  Temp:  [98.8 F (37.1 C)-99.2 F (37.3 C)] 98.8 F (37.1 C) (01/29 1029) Pulse Rate:  [95-112] 109 (01/29 1029) Resp:  [19-22] 19 (01/29 1029) BP: (141-180)/(53-78) 156/74 (01/29 1029) SpO2:  [96 %-98 %] 97 % (01/29  1029) Weight:  [133 lb 4.8 oz (60.5 kg)] 133 lb 4.8 oz (60.5 kg) (01/29 0500)  General - Well nourished, well developed, in no apparent distress, mildly lethargic.  Ophthalmologic - Fundi not visualized due to noncooperation.  Cardiovascular - Regular rate and rhythm.  Mental Status -  Level of arousal and orientation to place, and person were intact, but not to time. Language including expression, naming, repetition, comprehension was assessed and found intact, but moderate dysarthria.   Cranial Nerves II - XII - II - visual field grossly intact. III, IV, VI - right gaze preference, but able to cross midline. V - Facial sensation intact bilaterally. VII - left facial droop. VIII - Hearing & vestibular intact bilaterally. X - Palate elevates symmetrically, moderate dysarthria. XI - Chin turning & shoulder shrug intact bilaterally. XII - Tongue protrusion intact.  Motor Strength - The patient's strength was RUE and RLE at least 4/5, but LUE 1/5 and LLE 1/5. Bulk was normal and fasciculations were absent.   Motor Tone - Muscle tone was assessed at the neck and appendages and was increased on the left UE and LE.  Reflexes - The patient's reflexes were 1+ in all extremities and she had positive left babinski.  Sensory - Light touch, temperature/pinprick were assessed and were symmetrical.    Coordination - The patient had  normal movements in the right hand with no ataxia or dysmetria.  Tremor was absent.  Gait and Station - not able to test.   ASSESSMENT/PLAN Ms. Fernande BrasJacqueline N Fields is a 83 y.o. female with history of hypertension, hyperlipidemia, coronary artery disease, spinal stenosis, and aortic stenosis presenting with speech difficulties, left sided weakness, and right gaze deviation.  She received IV t-PA on 09/12/2016 at 1200.  Stroke:  Right BG/CR large infarct, more embolic pattern secondary to unknown source  Resultant  left hemiplegia, left gaze palsy and left neglect    MRI - Acute large infarct right BG and CR with small hemorrhagic transformation.  CTA H&N - right M1, and right A2 high grade stenosis, left CCA soft plaque vs. Thrombus, aortic atherosclerosis  Carotid Doppler - unremarkable  2D Echo - EF 65-70%. No cardiac source of emboli identified.  LE venous doppler - negative for DVT  TEE scheduled tomorrow  EP recommended 30 day cardiac event monitoring as oupt after discharge   LDL - 153  HgbA1c - 5.6  VTE prophylaxis - lovenox Diet NPO time specified  aspirin 81 mg daily prior to admission, now on aspirin suppository 300 mg daily. Hold off DAPT due to hemorrhagic transformation and the potential surgical procedure.  Patient counseled to be compliant with her antithrombotic medications  Ongoing aggressive stroke risk factor management  Therapy recommendations: Possible CIR -  MD consult - pending  Disposition: Pending  Hypertension  Stable Permissive hypertension (OK if < 180/105) but gradually normalize in 5-7 days BP goal 120-150 due to left MCA high grade stenosis  Hyperlipidemia  Home meds:  No lipid lowering medications prior to admission  LDL 153, goal < 70  Start Lipitor 40 mg daily  Continue statin at discharge  Dysphagia  Did not pass swallow  Need NG tube for nutrition  Speech following  Other Stroke Risk Factors  Advanced age  Coronary artery disease  Other Active Problems  Angioedema after t-PA -> received Solumedrol - will d/c    Hospital day # 4  Marvel PlanJindong Quintin Hjort, MD PhD Stroke Neurology 09/15/2016 1:31 PM    To contact Stroke Continuity provider, please refer to [email or web address]. After hours, contact General Neurology

## 2016-09-15 NOTE — Progress Notes (Addendum)
Received notification from Lab for Pt Potassium critical value 2.4 notified provider. Provider gave verbal order for 40 meq of potassium patient is NPO so she is receiving it via IV.

## 2016-09-15 NOTE — Progress Notes (Signed)
Speech Language Pathology Treatment: Dysphagia;Cognitive-Linquistic  Patient Details Name: [name] BrasJacqueline N Odland MRN: [id number] DOB: 22-Mar-1925 Today's Date: 09/15/2016 Time: [id number] SLP Time Calculation (min) (ACUTE ONLY): 23 min  Assessment / Plan / Recommendation Clinical Impression  Pt said she was tired, but was agreeable to participate in speech therapy. She had improved sustained attention to PO trials, but she does not attend to her left environment despite cueing. She does bring her head more toward midline when prompted. She continues to have oral holding and anterior loss, requiring Mod cues for management. She has multiple swallows following even small trials of ice chips and thin liquids. Hyolaryngeal movement feels reduced to palpation, as was noted on MBS. Wet vocal quality and delayed throat clearing is concerning for airway penetration and/or aspiration. Despite Mod cues from SLP for more effortful coughing, she was not able to produce much force. Although pt is showing mild improvements in her mentation, she continues to show signs of dysphagia. Would recommend continuing NPO for now, but would consider MBS over the next few days to assess for progress.    HPI HPI: 83-yo RH woman who is brought to the ED with difficulty speaking. She developed left-sided weakness and R gfaze deviation with symptoms fluctuating until after she arrived in the ED. CT Head showed no acute changes, and tPA was initiated. Pt had left-sided angioedema of the lip and tongue that increased during administration. MRI showed acute infarct right lenticular nucleus with 7 mm focus of hemorrhage within the infarct.      SLP Plan  Continue with current plan of care     Recommendations  Diet recommendations: NPO Medication Administration: Via alternative means                Oral Care Recommendations: Oral care QID Follow up Recommendations: Inpatient Rehab Plan: Continue with current plan of  care       GO                [name], Keasha Malkiewicz 09/15/2016, 12:54 PM  [name], M.A. CCC-SLP [phone]

## 2016-09-15 NOTE — Progress Notes (Signed)
PT Cancellation Note  Patient Details Name: [name] BrasJacqueline N Schaller MRN: [id number] DOB: 06/16/1925   Cancelled Treatment:    Reason Eval/Treat Not Completed: Medical issues which prohibited therapy.  Receiving K+ and will check later.   Ivar DrapeStout, Jeanine Caven E 09/15/2016, 8:23 AM   Samul Dadauth Almendra Loria, PT MS Acute Rehab Dept. Number: [facility] [phone] and [facility] [phone]

## 2016-09-15 NOTE — Progress Notes (Signed)
    [facility] has been requested to perform a transesophageal echocardiogram on Fernande BrasJacqueline N [place] for stroke.  After careful review of history and examination, the risks and benefits of transesophageal echocardiogram have been explained including risks of esophageal damage, perforation (1:10,000 risk), bleeding, pharyngeal hematoma as well as other potential complications associated with conscious sedation including aspiration, arrhythmia, respiratory failure and death. Alternatives to treatment were discussed, questions were answered. Patient is willing to proceed.   Corine ShelterLuke Issiac Jamar, [place]PA-C  09/15/2016 6:26 PM

## 2016-09-15 NOTE — Progress Notes (Signed)
[facility] p[facility], spoke to staff, they will be at bedside this afternoon. Will continue to monitor.

## 2016-09-15 NOTE — Progress Notes (Signed)
Occupational Therapy Treatment Patient Details Name: [name] Fields MRN: [id number] DOB: 09-04-1924 Today's Date: 09/15/2016    History of present illness 83 y.o. female with history of hypertension, hyperlipidemia, coronary artery disease, spinal stenosis, and aortic stenosis presenting with speech difficulties, left sided weakness, and right gaze deviation.  She received IV t-PA on 08/31/2016 at 1200   OT comments  Pt progressing toward OT goals. She continues to require max assist for bed mobility to sit at EOB in preparation for seated ADL tasks. With verbal cueing and anchoring techniques, pt able to maintain midline gaze for a few minutes at a time and she was able to turn her head to cross midline 3x this session as well. She additionally demonstrates improved awareness and sustained attention to ADL task at hand. Pt remains confused and was talking to her daughter-in-law throughout session although she was not present. D/C plan remains appropriate. OT will continue to follow acutely.   During bed mobility, noted dried blood on pillow under pt's L LE and some fresh blood as well with open skin tear on back of pt's L lower leg. Notified RN who assessed and will provide bandage and monitor.   Follow Up Recommendations  CIR;Supervision/Assistance - 24 hour    Equipment Recommendations  Other (comment) (TBD at next venue of care)    Recommendations for Other Services      Precautions / Restrictions Precautions Precautions: Fall Precaution Comments: watch BP Restrictions Weight Bearing Restrictions: No       Mobility Bed Mobility Overal bed mobility: Needs Assistance Bed Mobility: Supine to Sit     Supine to sit: Max assist Sit to supine: Max assist   General bed mobility comments: Physical assistance required to position L UE and to raise trunk from bed.  Transfers                      Balance Overall balance assessment: Needs assistance Sitting-balance  support: Feet supported;Bilateral upper extremity supported Sitting balance-Leahy Scale: Fair Sitting balance - Comments: Able to momentarily maintain sitting balance at EOB but significant posterior lean and more stable with UE support. Postural control: Posterior lean                         ADL Overall ADL's : Needs assistance/impaired     Grooming: Moderate assistance;Sitting Grooming Details (indicate cue type and reason): Mod assist to maintain balance while seated at EOB.                               General ADL Comments: Pt able to sustain midline gaze with VC's and anchoring techniques for 2-3 minutes at a time. Requires verbal cueing  to scan to left during seated ADL tasks. Able to cross midline gaze 3 times during ADL tasks.      Vision                 Additional Comments: R gaze remains. Pt requires VC's, increased time and anchoring techniques to cross midline. Noted improvements with this at end of session.   Perception     Praxis      Cognition   Behavior During Therapy: WFL for tasks assessed/performed Overall Cognitive Status: Impaired/Different from baseline Area of Impairment: Attention;Memory;Following commands;Awareness;Safety/judgement;Orientation;Problem solving Orientation Level: Disoriented to;Time;Place Current Attention Level: Sustained Memory: Decreased short-term memory  Following Commands: Follows one step commands inconsistently;Follows one  step commands with increased time Safety/Judgement: Decreased awareness of safety;Decreased awareness of deficits Awareness: Intellectual Problem Solving: Slow processing;Decreased initiation;Difficulty sequencing;Requires verbal cues;Requires tactile cues General Comments: Pt thinking caregiver is in room with her but no family or friends present during session. Continued to ask questions to her despite redirection that caregiver not present at this time.    Extremity/Trunk  Assessment               Exercises     Shoulder Instructions       General Comments      Pertinent Vitals/ Pain       Pain Assessment: No/denies pain  Home Living                                          Prior Functioning/Environment              Frequency  Min 3X/week        Progress Toward Goals  OT Goals(current goals can now be found in the care plan section)  Progress towards OT goals: Progressing toward goals  Acute Rehab OT Goals Patient Stated Goal: none stated OT Goal Formulation: With patient Time For Goal Achievement: 09/21/16 Potential to Achieve Goals: Good ADL Goals Pt Will Perform Grooming: with min assist;standing Pt Will Perform Upper Body Bathing: with min guard assist;standing Pt Will Perform Lower Body Bathing: with min assist;sit to/from stand Pt Will Transfer to Toilet: with min assist;ambulating;bedside commode Pt Will Perform Toileting - Clothing Manipulation and hygiene: with min assist;sit to/from stand Pt/caregiver will Perform Home Exercise Program: Increased ROM;Left upper extremity;With written HEP provided Additional ADL Goal #1: Pt will complete bed mobility with min guard assist in preparation for ADL tasks.  Plan Discharge plan remains appropriate    Co-evaluation                 End of Session     Activity Tolerance Patient tolerated treatment well   Patient Left in bed;with call bell/phone within reach   Nurse Communication Mobility status;Other (comment) (Noted old and new blood from skin tear on L lower leg)        Time: 1350-1409 OT Time Calculation (min): 19 min  Charges: OT General Charges $OT Visit: 1 Procedure OT Treatments $Therapeutic Activity: 8-22 mins  Doristine Section, OTR/L [phone] 09/15/2016, 4:08 PM

## 2016-09-15 NOTE — Consult Note (Signed)
Physical Medicine and Rehabilitation Consult Reason for Consult: Acute infarct right lenticular nucleus Referring Physician: Dr.Xu   HPI: [name] Fields is a 83 y.o.right handed  female with history of mild aortic stenosis, CAD maintain on aspirin 81 mg daily, hypertension, hyperlipidemia. Per chart review, patient lived alone and reported to be independent prior to admission using a walker. One level home with level entry. She has a daughter-in-law who checks on her often. Presented 08/21/2016 after a fall from her bed with left-sided weakness and difficulty speaking. No loss of consciousness. Cranial CT scan showed no acute changes. Patient did receive TPA. CT cervical spine negative. MRI of the brain, reviewed showing infarct right lenticular nuclear. 7 mm focus of hemorrhage within the infarct. MRA showed moderate focal stenosis proximal right posterior cerebral artery as well as moderate stenosis right A2 segment. Echocardiogram with ejection fraction of 70% grade 1 diastolic dysfunction. CTA of neck showed short segment moderate stenosis of approximately 50% at the proximal right subclavian artery. Venous Dopplers lower extremities negative for DVT. Neurology consulted presently maintained on aspirin therapy for CVA prophylaxis. Subcutaneous Lovenox for DVT prophylaxis. Plan for TEE loop recorder 09/15/2016. Physical and occupational therapy evaluations completed with recommendations of physical medicine rehabilitation consult.   Review of Systems  Constitutional: Negative for chills and fever.  HENT: Negative for hearing loss and tinnitus.   Eyes: Negative for blurred vision and double vision.  Respiratory: Negative for cough and shortness of breath.   Gastrointestinal: Positive for constipation. Negative for nausea and vomiting.  Genitourinary: Positive for urgency. Negative for dysuria, flank pain and hematuria.  Musculoskeletal: Positive for back pain, falls and myalgias.    Skin: Negative for rash.  Neurological: Positive for speech change, focal weakness, weakness and headaches.  Psychiatric/Behavioral: Positive for depression.  All other systems reviewed and are negative.  Past Medical History:  Diagnosis Date  . Aortic stenosis    mild  . Arthritis   . Chest pain   . Coronary artery disease    1st diag. stenosis of 70%  . Hx stress fracture    Lumbar 4-5  . Hyperlipidemia   . Hypertension   . Osteoporosis   . Shingles   . Spinal stenosis   . UTI (lower urinary tract infection)    Past Surgical History:  Procedure Laterality Date  . ABDOMINAL SURGERY     bowel obstruction  . CARDIAC CATHETERIZATION  10/18/2007   mild to moderate CAD,aggressive medical therapy  . EYE SURGERY     cataracts bil   History reviewed. No pertinent family history of premature CVA. Social History:  reports that she has never smoked. She has never used smokeless tobacco. She reports that she does not drink alcohol or use drugs. Allergies:  Allergies  Allergen Reactions  . Tape Hives   Medications Prior to Admission  Medication Sig Dispense Refill  . amLODipine (NORVASC) 2.5 MG tablet Take 2.5 mg by mouth daily.    Marland Kitchen aspirin EC 81 MG tablet Take 81 mg by mouth daily.    Marland Kitchen escitalopram (LEXAPRO) 10 MG tablet Take 10 mg by mouth daily.  6  . esomeprazole (NEXIUM) 20 MG capsule Take 20 mg by mouth daily.     . fluticasone (FLONASE) 50 MCG/ACT nasal spray Place 1 spray into both nostrils daily as needed for rhinitis.     Marland Kitchen lisinopril (PRINIVIL,ZESTRIL) 5 MG tablet Take 5 mg by mouth daily.  6  . metoprolol succinate (TOPROL-XL) 25  MG 24 hr tablet Take 37.5 mg by mouth daily.    . mirabegron ER (MYRBETRIQ) 25 MG TB24 tablet Take 25 mg by mouth at bedtime.       Home: Home Living Family/patient expects to be discharged to:: Private residence Living Arrangements: Alone Available Help at Discharge: Family, Friend(s) Type of Home: House Home Access: Level  entry Home Layout: One level Bathroom Shower/Tub:  (Reports she does not know) Additional Comments: Question reliability of pt report. Pt reporting unsure of bathroom set-up.  Functional History: Prior Function Comments: Per PT note grandchildren report pt was fairly independent. Difficult to obtain PLOF from pt this date as she was confused. Functional Status:  Mobility: Bed Mobility Overal bed mobility: Needs Assistance Bed Mobility: Supine to Sit Supine to sit: Max assist, +2 for physical assistance, Mod assist Sit to supine: Mod assist General bed mobility comments: Physical assistance required to position L UE and to raise trunk from bed. Transfers Overall transfer level: Needs assistance Equipment used: 1 person hand held assist Transfers: Sit to/from Stand Sit to Stand: Max assist, +2 safety/equipment General transfer comment: Max assist to power up into standing with L LE blocked. Ambulation/Gait General Gait Details: not attempted    ADL: ADL Overall ADL's : Needs assistance/impaired Eating/Feeding: NPO Grooming: Brushing hair, Wash/dry face, Min guard, Cueing for safety, Sitting Upper Body Bathing: Moderate assistance, Sitting Lower Body Bathing: Maximal assistance, Sitting/lateral leans Upper Body Dressing : Moderate assistance, Sitting Lower Body Dressing: Maximal assistance, Sitting/lateral leans Toilet Transfer: Maximal assistance, +2 for safety/equipment Toilet Transfer Details (indicate cue type and reason): +2 assist to manage equipment and for safety. Toileting- Clothing Manipulation and Hygiene: Maximal assistance, Sit to/from stand, +2 for safety/equipment General ADL Comments: Pt with decreased attention to task and ability to follow commands which impacted participation with ADL. R gaze requiring max VC's to maintain midline or cross midline gaze. Only able to cross midline gaze momentarily. Significant neglect of L UE often having it pinned behind her and  requiring verbal and tactile cues to correct positioning.  Cognition: Cognition Overall Cognitive Status: Impaired/Different from baseline Arousal/Alertness: Awake/alert Orientation Level: Oriented to person, Oriented to place, Oriented to situation, Disoriented to time Attention: Sustained Sustained Attention: Impaired Sustained Attention Impairment: Verbal basic, Functional basic Memory: Impaired Memory Impairment: Decreased recall of new information Awareness: Impaired Awareness Impairment: Intellectual impairment, Emergent impairment, Anticipatory impairment Problem Solving: Impaired Problem Solving Impairment: Verbal basic, Functional basic Safety/Judgment: Impaired Comments: left inattention Cognition Arousal/Alertness: Awake/alert Behavior During Therapy: WFL for tasks assessed/performed Overall Cognitive Status: Impaired/Different from baseline Area of Impairment: Attention, Memory, Following commands, Awareness, Safety/judgement, Orientation, Problem solving Orientation Level: Disoriented to, Time, Place Current Attention Level: Sustained Memory: Decreased short-term memory Following Commands: Follows one step commands inconsistently, Follows one step commands with increased time Safety/Judgement: Decreased awareness of safety, Decreased awareness of deficits Awareness: Intellectual Problem Solving: Slow processing, Decreased initiation, Difficulty sequencing, Requires verbal cues, Requires tactile cues General Comments: Pt confused throughout session reporting that therapy tech is her caregiver at home. Disoriented to place and time.  Blood pressure (!) 155/77, pulse (!) 112, temperature 99 F (37.2 C), temperature source Oral, resp. rate 20, height 5' (1.524 m), weight 60.5 kg (133 lb 4.8 oz), SpO2 98 %. Physical Exam  Vitals reviewed. Constitutional: She appears well-developed.  Frail  HENT:  Head: Normocephalic and atraumatic.  Eyes: EOM are normal.  Patient with  right gaze preference. Pupils reactive to light  Neck: Normal range of motion. Neck supple.  No thyromegaly present.  Cardiovascular: Normal rate and regular rhythm.   Respiratory: Effort normal. No respiratory distress.  She has some upper airway congestion  GI: Soft. Bowel sounds are normal. She exhibits no distension.  Musculoskeletal: She exhibits no edema or tenderness.  Neurological: She is alert.  Speech is dysarthric but intelligible.  Follows simple commands.  She does appear to neglect the left side. A&Ox1 DTRs symmetric Motor: RUE: 4/5 proximal to distal LUE: 0/5 proximal to distal, flexion tone LLE: 1/5 proximal to distal, extension tone  Skin: Skin is warm and dry.  Psychiatric: Her affect is blunt. Her speech is delayed. She is slowed. Cognition and memory are impaired.    Results for orders placed or performed during the hospital encounter of 09/02/2016 (from the past 24 hour(s))  CBC     Status: Abnormal   Collection Time: 09/14/16  8:09 AM  Result Value Ref Range   WBC 14.0 (H) 4.0 - 10.5 K/uL   RBC 4.12 3.87 - 5.11 MIL/uL   Hemoglobin 12.1 12.0 - 15.0 g/dL   HCT 52.8 (L) 41.3 - 24.4 %   MCV 84.2 78.0 - 100.0 fL   MCH 29.4 26.0 - 34.0 pg   MCHC 34.9 30.0 - 36.0 g/dL   RDW 01.0 27.2 - 53.6 %   Platelets SPECIMEN CLOTTED 150 - 400 K/uL  TSH     Status: None   Collection Time: 09/14/16  8:09 AM  Result Value Ref Range   TSH 1.938 0.350 - 4.500 uIU/mL  Basic metabolic panel     Status: Abnormal   Collection Time: 09/14/16 10:56 AM  Result Value Ref Range   Sodium 136 135 - 145 mmol/L   Potassium 2.8 (L) 3.5 - 5.1 mmol/L   Chloride 105 101 - 111 mmol/L   CO2 19 (L) 22 - 32 mmol/L   Glucose, Bld 101 (H) 65 - 99 mg/dL   BUN 14 6 - 20 mg/dL   Creatinine, Ser 6.44 0.44 - 1.00 mg/dL   Calcium 8.2 (L) 8.9 - 10.3 mg/dL   GFR calc non Af Amer >60 >60 mL/min   GFR calc Af Amer >60 >60 mL/min   Anion gap 12 5 - 15  Vitamin B12     Status: Abnormal   Collection Time:  09/14/16 10:56 AM  Result Value Ref Range   Vitamin B-12 179 (L) 180 - 914 pg/mL  Glucose, capillary     Status: None   Collection Time: 09/14/16  8:23 PM  Result Value Ref Range   Glucose-Capillary 92 65 - 99 mg/dL   Comment 1 Notify RN    Comment 2 Document in Chart   Glucose, capillary     Status: None   Collection Time: 09/14/16 11:58 PM  Result Value Ref Range   Glucose-Capillary 98 65 - 99 mg/dL   Comment 1 Notify RN    Comment 2 Document in Chart   CBC     Status: Abnormal   Collection Time: 09/15/16  1:43 AM  Result Value Ref Range   WBC 12.3 (H) 4.0 - 10.5 K/uL   RBC 4.70 3.87 - 5.11 MIL/uL   Hemoglobin 12.8 12.0 - 15.0 g/dL   HCT 03.4 74.2 - 59.5 %   MCV 82.1 78.0 - 100.0 fL   MCH 27.2 26.0 - 34.0 pg   MCHC 33.2 30.0 - 36.0 g/dL   RDW 63.8 75.6 - 43.3 %   Platelets 269 150 - 400 K/uL  Basic metabolic panel  Status: Abnormal   Collection Time: 09/15/16  1:43 AM  Result Value Ref Range   Sodium 136 135 - 145 mmol/L   Potassium 2.4 (LL) 3.5 - 5.1 mmol/L   Chloride 106 101 - 111 mmol/L   CO2 22 22 - 32 mmol/L   Glucose, Bld 96 65 - 99 mg/dL   BUN 11 6 - 20 mg/dL   Creatinine, Ser 1.61 0.44 - 1.00 mg/dL   Calcium 7.9 (L) 8.9 - 10.3 mg/dL   GFR calc non Af Amer >60 >60 mL/min   GFR calc Af Amer >60 >60 mL/min   Anion gap 8 5 - 15  Glucose, capillary     Status: Abnormal   Collection Time: 09/15/16  4:12 AM  Result Value Ref Range   Glucose-Capillary 107 (H) 65 - 99 mg/dL   Comment 1 Notify RN    Comment 2 Document in Chart    Dg Abd Portable 1v  Result Date: 09/14/2016 CLINICAL DATA:  Check feeding catheter placement EXAM: PORTABLE ABDOMEN - 1 VIEW COMPARISON:  09/14/2011 FINDINGS: Previously seen feeding catheter is no longer within the stomach. It is not visualized on the abdominal film. A subsequent chest x-ray was obtained and shows the feeding catheter coiled within the upper esophagus prior to the thoracic inlet. This should be withdrawn completely and  reinserted IMPRESSION: Feeding catheter coiled within the pharynx/cervical esophagus Electronically Signed   By: Alcide Clever M.D.   On: 09/14/2016 18:57   Dg Abd Portable 1v  Result Date: 09/13/2016 CLINICAL DATA:  Feeding tube placement. EXAM: PORTABLE ABDOMEN - 1 VIEW COMPARISON:  None. FINDINGS: A small bore feeding tube is identified with tip overlying the proximal duodenum. No dilated bowel loops are present. Lower lumbar surgical changes are present. IMPRESSION: Small bore feeding tube with tip overlying the proximal duodenum. Electronically Signed   By: Harmon Pier M.D.   On: 09/13/2016 15:06    Assessment/Plan: Diagnosis: Acute infarct right lenticular nucleus Labs and images independently reviewed.  Records reviewed and summated above. Stroke: Continue secondary stroke prophylaxis and Risk Factor Modification listed below:   Antiplatelet therapy:   Blood Pressure Management:  Continue current medication with prn's with permisive HTN per primary team Statin Agent:   Right sided hemiparesis: fit for orthosis to prevent contractures (resting hand splint for day, wrist cock up splint at night, PRAFO, etc)  Motor recovery: Fluoxetine  1. Does the need for close, 24 hr/day medical supervision in concert with the patient's rehab needs make it unreasonable for this patient to be served in a less intensive setting? Yes  2. Co-Morbidities requiring supervision/potential complications: mild aortic stenosis (Monitor in accordance with increased physical activity and avoid UE resistance excercises), CAD (cont meds), HTN (monitor and provide prns in accordance with increased physical exertion and pain), hyperlipidemia (cont meds), diastolic dysfunction (monitor for signs/symptoms of fluid overload), Tachycardia (monitor in accordance with pain and increasing activity), hypokalemia (continue to monitor and replete as necessary), Vit B12 deficiency (supplement), leukocytosis (cont to monitor for signs  and symptoms of infection, further workup if indicated), spasticity (consider meds), dysphagia (currently NPO, advance diet as tolerated) 3. Due to bladder management, bowel management, safety, disease management, medication administration and patient education, does the patient require 24 hr/day rehab nursing? Yes 4. Does the patient require coordinated care of a physician, rehab nurse, PT (1-2 hrs/day, 5 days/week), OT (1-2 hrs/day, 5 days/week) and SLP (1-2 hrs/day, 5 days/week) to address physical and functional deficits in the context of  the above medical diagnosis(es)? Yes Addressing deficits in the following areas: balance, endurance, locomotion, strength, transferring, bathing, dressing, feeding, toileting, cognition, speech, language, swallowing and psychosocial support 5. Can the patient actively participate in an intensive therapy program of at least 3 hrs of therapy per day at least 5 days per week? Yes 6. The potential for patient to make measurable gains while on inpatient rehab is excellent 7. Anticipated functional outcomes upon discharge from inpatient rehab are min assist  with PT, min assist and mod assist with OT, supervision and min assist with SLP. 8. Estimated rehab length of stay to reach the above functional goals is: 18-22 days. 9. Does the patient have adequate social supports and living environment to accommodate these discharge functional goals? Potentially 10. Anticipated D/C setting: Home 11. Anticipated post D/C treatments: HH therapy and Home excercise program 12. Overall Rehab/Functional Prognosis: good  RECOMMENDATIONS: This patient's condition is appropriate for continued rehabilitative care in the following setting: If adequate caregiver support available at discharge, would recommend CIR, however, pt will require assistance at discharge. Further, will need to inquire about baseline level of functioning as pt has signficant tone in right side, ?previous stroke. Will  await completion of medical workup. Patient has agreed to participate in recommended program. Potentially Note that insurance prior authorization may be required for reimbursement for recommended care.  Comment: Rehab Admissions Coordinator to follow up.  Maryla Morrow, MD, [place] Dom Charlton Amor., PA-C 09/15/2016

## 2016-09-15 NOTE — Progress Notes (Signed)
PT Cancellation Note  Patient Details Name: [name] Fields MRN: [id number] DOB: [birth year]   Cancelled Treatment:    Reason Eval/Treat Not Completed: Other (comment) (Pt reports she is too tired).  Will try again tomorrow as she has reported being fatigued over her trips for testing.   Ivar DrapeStout, Carmeron Heady E 09/15/2016, 12:15 PM   Samul Dadauth Devonia Farro, PT MS Acute Rehab Dept. Number: [facility] [phone] and [facility] [phone]

## 2016-09-16 ENCOUNTER — Encounter (HOSPITAL_COMMUNITY): Admission: EM | Disposition: E | Payer: Self-pay | Source: Home / Self Care | Attending: Neurology

## 2016-09-16 ENCOUNTER — Inpatient Hospital Stay (HOSPITAL_COMMUNITY): Payer: Medicare Other

## 2016-09-16 ENCOUNTER — Encounter (HOSPITAL_COMMUNITY): Payer: Self-pay | Admitting: Cardiovascular Disease

## 2016-09-16 DIAGNOSIS — I6300 Cerebral infarction due to thrombosis of unspecified precerebral artery: Secondary | ICD-10-CM

## 2016-09-16 DIAGNOSIS — R400 Somnolence: Secondary | ICD-10-CM

## 2016-09-16 DIAGNOSIS — J9600 Acute respiratory failure, unspecified whether with hypoxia or hypercapnia: Secondary | ICD-10-CM

## 2016-09-16 DIAGNOSIS — Z515 Encounter for palliative care: Secondary | ICD-10-CM

## 2016-09-16 LAB — GLUCOSE, CAPILLARY
Glucose-Capillary: 121 mg/dL — ABNORMAL HIGH (ref 65–99)
Glucose-Capillary: 131 mg/dL — ABNORMAL HIGH (ref 65–99)
Glucose-Capillary: 150 mg/dL — ABNORMAL HIGH (ref 65–99)

## 2016-09-16 LAB — CBC
HEMATOCRIT: 40.4 % (ref 36.0–46.0)
HEMOGLOBIN: 13.4 g/dL (ref 12.0–15.0)
MCH: 27.2 pg (ref 26.0–34.0)
MCHC: 33.2 g/dL (ref 30.0–36.0)
MCV: 81.9 fL (ref 78.0–100.0)
Platelets: 256 10*3/uL (ref 150–400)
RBC: 4.93 MIL/uL (ref 3.87–5.11)
RDW: 13.7 % (ref 11.5–15.5)
WBC: 14.1 10*3/uL — ABNORMAL HIGH (ref 4.0–10.5)

## 2016-09-16 LAB — BLOOD GAS, ARTERIAL
ACID-BASE DEFICIT: 0.7 mmol/L (ref 0.0–2.0)
BICARBONATE: 22.4 mmol/L (ref 20.0–28.0)
DRAWN BY: 312971
FIO2: 0.6
O2 Saturation: 99.9 %
PCO2 ART: 31.8 mmHg — AB (ref 32.0–48.0)
Patient temperature: 100.2
pH, Arterial: 7.465 — ABNORMAL HIGH (ref 7.350–7.450)
pO2, Arterial: 451 mmHg — ABNORMAL HIGH (ref 83.0–108.0)

## 2016-09-16 LAB — BASIC METABOLIC PANEL
Anion gap: 9 (ref 5–15)
BUN: 8 mg/dL (ref 6–20)
CHLORIDE: 107 mmol/L (ref 101–111)
CO2: 22 mmol/L (ref 22–32)
CREATININE: 0.54 mg/dL (ref 0.44–1.00)
Calcium: 8.2 mg/dL — ABNORMAL LOW (ref 8.9–10.3)
GFR calc non Af Amer: >60 mL/min (ref >60)
Glucose, Bld: 126 mg/dL — ABNORMAL HIGH (ref 65–99)
Potassium: 3.2 mmol/L — ABNORMAL LOW (ref 3.5–5.1)
Sodium: 138 mmol/L (ref 135–145)

## 2016-09-16 SURGERY — CANCELLED PROCEDURE

## 2016-09-16 MED ORDER — HALOPERIDOL LACTATE 2 MG/ML PO CONC
0.5000 mg | ORAL | Status: DC | PRN
  Filled 2016-09-16: qty 0.3

## 2016-09-16 MED ORDER — ONDANSETRON HCL 4 MG/2ML IJ SOLN: 4.0000 mg | Freq: Four times a day (QID) | INTRAMUSCULAR | Status: DC | PRN

## 2016-09-16 MED ORDER — FUROSEMIDE 10 MG/ML IJ SOLN
40.0000 mg | Freq: Once | INTRAMUSCULAR | Status: AC
Start: 2016-09-16 — End: 2016-09-16
  Administered 2016-09-16: 40 mg via INTRAVENOUS

## 2016-09-16 MED ORDER — DEXAMETHASONE SODIUM PHOSPHATE 10 MG/ML IJ SOLN: 6.0000 mg | Freq: Once | INTRAMUSCULAR | Status: DC

## 2016-09-16 MED ORDER — LORAZEPAM 2 MG/ML IJ SOLN: 1.0000 mg | INTRAMUSCULAR | Status: DC | PRN

## 2016-09-16 MED ORDER — BIOTENE DRY MOUTH MT LIQD: 15.0000 mL | OROMUCOSAL | Status: DC | PRN

## 2016-09-16 MED ORDER — GLYCOPYRROLATE 0.2 MG/ML IJ SOLN: 0.2000 mg | INTRAMUSCULAR | Status: DC | PRN

## 2016-09-16 MED ORDER — SODIUM CHLORIDE 0.9 % IV SOLN
1.0000 mg/h | INTRAVENOUS | Status: DC
  Filled 2016-09-16: qty 10

## 2016-09-16 MED ORDER — GLYCOPYRROLATE 1 MG PO TABS
1.0000 mg | ORAL_TABLET | ORAL | Status: DC | PRN
  Filled 2016-09-16: qty 1

## 2016-09-16 MED ORDER — ONDANSETRON 4 MG PO TBDP: 4.0000 mg | ORAL_TABLET | Freq: Four times a day (QID) | ORAL | Status: DC | PRN

## 2016-09-16 MED ORDER — HALOPERIDOL LACTATE 5 MG/ML IJ SOLN: 0.5000 mg | INTRAMUSCULAR | Status: DC | PRN

## 2016-09-16 MED ORDER — MORPHINE SULFATE (PF) 2 MG/ML IV SOLN
INTRAVENOUS | Status: AC
  Filled 2016-09-16: qty 1

## 2016-09-16 MED ORDER — FUROSEMIDE 10 MG/ML IJ SOLN
INTRAMUSCULAR | Status: AC
  Filled 2016-09-16: qty 4

## 2016-09-16 MED ORDER — HALOPERIDOL 1 MG PO TABS: 0.5000 mg | ORAL_TABLET | ORAL | Status: DC | PRN

## 2016-09-16 MED ORDER — POLYVINYL ALCOHOL 1.4 % OP SOLN
1.0000 [drp] | Freq: Four times a day (QID) | OPHTHALMIC | Status: DC | PRN
  Filled 2016-09-16: qty 15

## 2016-09-16 MED ORDER — MORPHINE SULFATE (PF) 2 MG/ML IV SOLN
1.0000 mg | Freq: Once | INTRAVENOUS | Status: AC
  Administered 2016-09-16: 1 mg via INTRAVENOUS

## 2016-09-16 MED ORDER — FUROSEMIDE 10 MG/ML IJ SOLN: 40.0000 mg | Freq: Two times a day (BID) | INTRAMUSCULAR | Status: DC

## 2016-09-16 MED ORDER — GLYCOPYRROLATE 0.2 MG/ML IJ SOLN
0.2000 mg | INTRAMUSCULAR | Status: DC | PRN
  Administered 2016-09-17: 0.2 mg via INTRAVENOUS
  Filled 2016-09-16: qty 1

## 2016-09-16 NOTE — Plan of Care (Signed)
Pt daughter in law arrived and she discussed with pt son and they requested comfort care.   I also called pt son Jonny RuizJohn over the phone and updated him about current clinical condition, potential causes and treatment options. He understands that pt elderly with left hemiplegia and may not have good neurological recovery. On top of this, she developed respiratory distress and really struggling for air. She is DNR and DNI. Under this condition, he requests comfort care measures. I agree with his decision. Will put in comfort care measures.  [name] PlanJindong Abdelrahman Nair, MD PhD Stroke Neurology March 20, 2017 2:07 PM

## 2016-09-16 NOTE — H&P (Addendum)
Patient ID: [name] Fields MRN: [id number] DOB/AGE: 81-29-1926 81 y.o.  Admit date: 09/12/2016  Chief Complaint    stroke   HPI: [name] Fields is an [age]F with mid aortic stenosis, CAD, hyperlipidemia and hypertension here with a stroke.  She presented 1/25 with facial droop, dyarthria and L sided weakness.  She received tPA.  Echo revaled LVEF 65-70% with grade 1 diastolic dysfunction and mild AR/MR.  Cardiology was requested to perform TEE for source of stroke evaluation.  She has not passed her swallow evaluation.     Past Medical History:  Diagnosis Date  . Aortic stenosis    mild  . Arthritis   . Chest pain   . Coronary artery disease    1st diag. stenosis of 70%  . Hx stress fracture    Lumbar 4-5  . Hyperlipidemia   . Hypertension   . Osteoporosis   . Shingles   . Spinal stenosis   . UTI (lower urinary tract infection)     Medications Prior to Admission  Medication Sig Dispense Refill  . amLODipine (NORVASC) 2.5 MG tablet Take 2.5 mg by mouth daily.    Marland Kitchen. aspirin EC 81 MG tablet Take 81 mg by mouth daily.    Marland Kitchen. escitalopram (LEXAPRO) 10 MG tablet Take 10 mg by mouth daily.  6  . esomeprazole (NEXIUM) 20 MG capsule Take 20 mg by mouth daily.     . fluticasone (FLONASE) 50 MCG/ACT nasal spray Place 1 spray into both nostrils daily as needed for rhinitis.     Marland Kitchen. lisinopril (PRINIVIL,ZESTRIL) 5 MG tablet Take 5 mg by mouth daily.  6  . metoprolol succinate (TOPROL-XL) 25 MG 24 hr tablet Take 37.5 mg by mouth daily.    . mirabegron ER (MYRBETRIQ) 25 MG TB24 tablet Take 25 mg by mouth at bedtime.        Allergies  Allergen Reactions  . Tape Hives    Social History   Social History  . Marital status: Widowed    Spouse name: N/A  . Number of children: N/A  . Years of education: N/A   Occupational History  . Not on file.   Social History Main Topics  . Smoking status: Never Smoker  . Smokeless tobacco: Never Used  . Alcohol use No  . Drug use: No  .  Sexual activity: No   Other Topics Concern  . Not on file   Social History Narrative  . No narrative on file    History reviewed. No pertinent family history.  PHYSICAL EXAM: Vitals:   Oct 10, 2016 0548 Oct 10, 2016 0610  BP: (!) 186/90 (!) 179/80  Pulse: (!) 116   Resp: 20   Temp: 99 F (37.2 C)    General:  Frail and ill-appearing. No respiratory difficulty HEENT: Very dry mucous membranes.  Dobhoff tube in place. Neck: supple. no JVD. Carotids 2+ bilat; no bruits. No lymphadenopathy or thryomegaly appreciated. Cor: PMI nondisplaced. Regular rate & rhythm. No rubs, gallops or murmurs. Lungs: clear to auscultation bilaterally on anterior exam.  Loud adventitious upper airway noise  Abdomen: soft, nontender, nondistended. No hepatosplenomegaly. No bruits or masses. Good bowel sounds. Extremities: no cyanosis, clubbing, rash, edema Neuro: Dysarthria. L hemiplegia.  Affect pleasant.   Results for orders placed or performed during the hospital encounter of 08/20/2016 (from the past 24 hour(s))  Glucose, capillary     Status: Abnormal   Collection Time: 09/15/16  8:35 PM  Result Value Ref Range   Glucose-Capillary 117 (  H) 65 - 99 mg/dL   Comment 1 Notify RN    Comment 2 Document in Chart   Glucose, capillary     Status: Abnormal   Collection Time: 09/15/16 11:51 PM  Result Value Ref Range   Glucose-Capillary 132 (H) 65 - 99 mg/dL   Comment 1 Notify RN    Comment 2 Document in Chart   CBC     Status: Abnormal   Collection Time: 09/12/2016  2:34 AM  Result Value Ref Range   WBC 14.1 (H) 4.0 - 10.5 K/uL   RBC 4.93 3.87 - 5.11 MIL/uL   Hemoglobin 13.4 12.0 - 15.0 g/dL   HCT 16.1 09.6 - 04.5 %   MCV 81.9 78.0 - 100.0 fL   MCH 27.2 26.0 - 34.0 pg   MCHC 33.2 30.0 - 36.0 g/dL   RDW 40.9 81.1 - 91.4 %   Platelets 256 150 - 400 K/uL  Basic metabolic panel     Status: Abnormal   Collection Time: 09/09/2016  2:34 AM  Result Value Ref Range   Sodium 138 135 - 145 mmol/L   Potassium 3.2  (L) 3.5 - 5.1 mmol/L   Chloride 107 101 - 111 mmol/L   CO2 22 22 - 32 mmol/L   Glucose, Bld 126 (H) 65 - 99 mg/dL   BUN 8 6 - 20 mg/dL   Creatinine, Ser 7.82 0.44 - 1.00 mg/dL   Calcium 8.2 (L) 8.9 - 10.3 mg/dL   GFR calc non Af Amer >60 >60 mL/min   GFR calc Af Amer >60 >60 mL/min   Anion gap 9 5 - 15  Glucose, capillary     Status: Abnormal   Collection Time: 08/26/2016  3:56 AM  Result Value Ref Range   Glucose-Capillary 121 (H) 65 - 99 mg/dL   Comment 1 Notify RN    Comment 2 Document in Chart    Dg Abd Portable 1v  Result Date: 09/15/2016 CLINICAL DATA:  Verify feeding tube placement. EXAM: PORTABLE ABDOMEN - 1 VIEW COMPARISON:  09/15/2014 at 15:14 FINDINGS: Feeding tube extends into the stomach and probably on into the duodenum. Position does not appear significantly different from the earlier exam. Bowel gas pattern is unremarkable. IMPRESSION: Feeding tube extends well into the stomach and probably into the duodenum. Electronically Signed   By: Ellery Plunk M.D.   On: 09/15/2016 22:50   Dg Abd Portable 1v  Result Date: 09/15/2016 CLINICAL DATA:  Feeding tube insertion. EXAM: PORTABLE ABDOMEN - 1 VIEW COMPARISON:  09/14/2016. FINDINGS: The of fall 2 noted, its tip may be in the upper duodenum. Oral contrast again noted in the colon. No bowel distention. Air-filled loops of small and large bowel noted. Mild adynamic ileus cannot be excluded. IMPRESSION: Feeding tube noted below the hemidiaphragms with tip most likely in duodenum. Air-filled loops of small and large bowel noted. These are non dilated. A mild adynamic ileus cannot be completely excluded . Electronically Signed   By: Maisie Fus  Register   On: 09/15/2016 15:24   Dg Abd Portable 1v  Result Date: 09/14/2016 CLINICAL DATA:  Check feeding catheter placement EXAM: PORTABLE ABDOMEN - 1 VIEW COMPARISON:  09/14/2011 FINDINGS: Previously seen feeding catheter is no longer within the stomach. It is not visualized on the abdominal  film. A subsequent chest x-ray was obtained and shows the feeding catheter coiled within the upper esophagus prior to the thoracic inlet. This should be withdrawn completely and reinserted IMPRESSION: Feeding catheter coiled within the pharynx/cervical esophagus  Electronically Signed   By: Alcide Clever M.D.   On: 09/14/2016 18:57     ASSESSMENT/PLAN:  [name] Fields is a 83 y.o. female with mid aortic stenosis, CAD, hyperlipidemia and hypertension here with a stroke.  She is extremely frail and has not been able to pass a swallow evaluation.  She currently has a Dobhoff feeding tube in place.  At this time it is unclear that she will even be able to take oral anticoagulation even if a thrombus is found.  Prefer to defer TEE until she is stronger and can pass a swallow evaluation.      Signed: Ora Mcnatt C. Duke Salvia, MD, [facility]  09/05/2016, 7:53 AM

## 2016-09-16 NOTE — Care Management Note (Signed)
Case Management Note  Patient Details  Name: [name] Fields MRN: [id number] Date of Birth: 11/05/1924  Subjective/Objective:                    Action/Plan: Pt transitioned to comfort care today. CM following for disposition.   Expected Discharge Date:                  Expected Discharge Plan:  IP Rehab Facility  In-House Referral:     Discharge planning Services  CM Consult  Post Acute Care Choice:    Choice offered to:     DME Arranged:    DME Agency:     HH Arranged:    HH Agency:     Status of Service:  In process, will continue to follow  If discussed at Long Length of Stay Meetings, dates discussed:    Additional Comments:  Kermit BaloKelli F Wilborn Membreno, RN 05-07-2017, 2:23 PM

## 2016-09-16 NOTE — Consult Note (Signed)
Name: [name] Fields MRN: [id number] DOB: 01-14-1925    ADMISSION DATE:  09/03/2016 CONSULTATION DATE:  1/30 REFERRING MD :  Roda Shutters  CHIEF COMPLAINT:   Acute respiratory failure   BRIEF PATIENT DESCRIPTION:  This is a 83 year old female who was admitted on 1/25 w/ acute CVA w/ resultant let sided weakness, dysarthria, dysphagia and right gaze pref. She was administered TPA in the ER on 1/25 at 1200. In spite of aggressive therapy she has not really shown much in the way of clinical improvement. PCCM was called to the room acutely on 1/30 when the pt was found with increased RR, paradoxical and labored respiratory efforts and escalating oxygen needs. On our arrival she appeared to be actively dying. She has been labeled DNR but primary service asked for our assistance to see if there were any other therapeutic options that might help treat her rather acute decline.   PAST MEDICAL HISTORY :   has a past medical history of Aortic stenosis; Arthritis; Chest pain; Coronary artery disease; stress fracture; Hyperlipidemia; Hypertension; Osteoporosis; Shingles; Spinal stenosis; and UTI (lower urinary tract infection).  has a past surgical history that includes Cardiac catheterization (10/18/2007); Abdominal surgery; and Eye surgery. Prior to Admission medications   Medication Sig Start Date End Date Taking? Authorizing Provider  amLODipine (NORVASC) 2.5 MG tablet Take 2.5 mg by mouth daily.   Yes Historical Provider, MD  aspirin EC 81 MG tablet Take 81 mg by mouth daily.   Yes Historical Provider, MD  escitalopram (LEXAPRO) 10 MG tablet Take 10 mg by mouth daily.   Yes Historical Provider, MD  esomeprazole (NEXIUM) 20 MG capsule Take 20 mg by mouth daily.    Yes Historical Provider, MD  fluticasone (FLONASE) 50 MCG/ACT nasal spray Place 1 spray into both nostrils daily as needed for rhinitis.    Yes Historical Provider, MD  lisinopril (PRINIVIL,ZESTRIL) 5 MG tablet Take 5 mg by mouth daily. 02/20/15   Yes Historical Provider, MD  metoprolol succinate (TOPROL-XL) 25 MG 24 hr tablet Take 37.5 mg by mouth daily.   Yes Historical Provider, MD  mirabegron ER (MYRBETRIQ) 25 MG TB24 tablet Take 25 mg by mouth at bedtime.    Yes Historical Provider, MD   Allergies  Allergen Reactions  . Tape Hives    FAMILY HISTORY:  family history is not on file. SOCIAL HISTORY:  reports that she has never smoked. She has never used smokeless tobacco. She reports that she does not drink alcohol or use drugs.  ROS Unable d/t critical illness  SUBJECTIVE:  In acute distress  VITAL SIGNS: Temp:  [97.5 F (36.4 C)-100.2 F (37.9 C)] 100.2 F (37.9 C) (01/30 1035) Pulse Rate:  [96-142] 131 (01/30 1210) Resp:  [18-30] 30 (01/30 1210) BP: (153-198)/(74-110) 179/110 (01/30 1210) SpO2:  [91 %-99 %] 99 % (01/30 1210) Weight:  [129 lb 12.8 oz (58.9 kg)] 129 lb 12.8 oz (58.9 kg) (01/30 0500)  PHYSICAL EXAMINATION: General appearance:  83 Year old  female, well nourished, currently in acute distress, dysarthric and aphasic  Eyes: anicteric sclerae, moist conjunctivae; PERRL, EOMI bilaterally. Mouth:  membranes and no mucosal ulcerations; normal hard and soft palate + upper airway wheeze  Neck: Trachea midline; neck supple, no JVD Lungs/chest: diffuse rhonchi, with Paradoxical respiratory effort and  Prolonged expiratory phase  CV: tachy RRR, no MRGs  Abdomen: Soft, non-tender; no masses or HSM Extremities: No peripheral edema or extremity lymphadenopathy Skin: Normal temperature, turgor and texture; no rash, ulcers  or subcutaneous nodules   Recent Labs Lab 09/14/16 1056 09/15/16 0143 09/15/2016 0234  NA 136 136 138  K 2.8* 2.4* 3.2*  CL 105 106 107  CO2 19* 22 22  BUN 14 11 8   CREATININE 0.61 0.58 0.54  GLUCOSE 101* 96 126*    Recent Labs Lab 09/14/16 0809 09/15/16 0143 09/02/2016 0234  HGB 12.1 12.8 13.4  HCT 34.7* 38.6 40.4  WBC 14.0* 12.3* 14.1*  PLT SPECIMEN CLOTTED 269 256   ABG      Component Value Date/Time   PHART 7.465 (H) 08/22/2016 1125   PCO2ART 31.8 (L) 09/05/2016 1125   PO2ART 451 (H) 08/26/2016 1125   HCO3 22.4 08/30/2016 1125   TCO2 25 02-Aug-2017 1132   ACIDBASEDEF 0.7 09/02/2016 1125   O2SAT 99.9 08/20/2016 1125   Dg Chest Port 1 View  Result Date: 09/15/2016 CLINICAL DATA:  Shortness of Breath EXAM: PORTABLE CHEST 1 VIEW COMPARISON:  05/23/2015 FINDINGS: Elevation of the right hemidiaphragm. There is bibasilar atelectasis. Cardiomegaly with mild vascular congestion. No overt edema or effusions. IMPRESSION: Cardiomegaly, vascular congestion. Elevated right hemidiaphragm.  Bibasilar atelectasis. Electronically Signed   By: Charlett NoseKevin  Dover M.D.   On: 09/10/2016 11:50   Dg Abd Portable 1v  Result Date: 09/15/2016 CLINICAL DATA:  Verify feeding tube placement. EXAM: PORTABLE ABDOMEN - 1 VIEW COMPARISON:  09/15/2014 at 15:14 FINDINGS: Feeding tube extends into the stomach and probably on into the duodenum. Position does not appear significantly different from the earlier exam. Bowel gas pattern is unremarkable. IMPRESSION: Feeding tube extends well into the stomach and probably into the duodenum. Electronically Signed   By: Ellery Plunkaniel R Mitchell M.D.   On: 09/15/2016 22:50   Dg Abd Portable 1v  Result Date: 09/15/2016 CLINICAL DATA:  Feeding tube insertion. EXAM: PORTABLE ABDOMEN - 1 VIEW COMPARISON:  09/14/2016. FINDINGS: The of fall 2 noted, its tip may be in the upper duodenum. Oral contrast again noted in the colon. No bowel distention. Air-filled loops of small and large bowel noted. Mild adynamic ileus cannot be excluded. IMPRESSION: Feeding tube noted below the hemidiaphragms with tip most likely in duodenum. Air-filled loops of small and large bowel noted. These are non dilated. A mild adynamic ileus cannot be completely excluded . Electronically Signed   By: Maisie Fushomas  Register   On: 09/15/2016 15:24   Dg Abd Portable 1v  Result Date: 09/14/2016 CLINICAL DATA:   Check feeding catheter placement EXAM: PORTABLE ABDOMEN - 1 VIEW COMPARISON:  09/14/2011 FINDINGS: Previously seen feeding catheter is no longer within the stomach. It is not visualized on the abdominal film. A subsequent chest x-ray was obtained and shows the feeding catheter coiled within the upper esophagus prior to the thoracic inlet. This should be withdrawn completely and reinserted IMPRESSION: Feeding catheter coiled within the pharynx/cervical esophagus Electronically Signed   By: Alcide CleverMark  Lukens M.D.   On: 09/14/2016 18:57    ASSESSMENT / PLAN: CVA Dysarthria  Dysphagia HTN Acute respiratory failure Sinus tachycardia  SIRS Hypokalemia   Acute Respiratory failure s/p CVA -has marked accessory muscle use w/ paradoxical respiratory efforts. Her CXR shows right HD elevation which appears more chronic but no clear infiltrates. Given clinical scenario w/ acute CVA and dysarthria would favor aspiration event and suspect subsequent CXR would likely demonstrate this progression. Her family is at bedside. They verify that she would not want invasive care and that comfort should be priority. I echo this recommendation as life support although may keep her alive she will  still have the same risks if not be at higher risk for this happening again were we to be successful in getting her off the vent.  Plan/rec Cont supplemental oxygen NOT BIPAP candidate PRN morphine PCCM will s/o.   Simonne Martinet ACNP-BC [facility] Pulmonary/Critical Care Pager # [phone] OR # [phone] if no answer  09/06/2016, 1:07 PM   STAFF NOTE: I, Rory Percy, MD FACP have personally reviewed patient's available data, including medical history, events of note, physical examination and test results as part of my evaluation. I have discussed with resident/NP and other care providers such as pharmacist, RN and RRT. In addition, I personally evaluated patient and elicited key findings of: lethargic, has severe resp  distress, gurgling upper airway sounds and moderate insp stridor wet, ronchi diffuse, paradoxical breathing, likely an asp event with above findings, r/o contrbution from pulm edema, decadron, lasix, 100% O2 support, not a candidate nimv will harm and not change outcome, morphine low dose for once and await family to visit, I am concenred this is NOT survivable and we should consider full comfort care with morphine drip and O2 support reduction, prognosis poor, dnr, no need ot move this pt, updated stroke at bedside The patient is critically ill with multiple organ systems failure and requires high complexity decision making for assessment and support, frequent evaluation and titration of therapies, application of advanced monitoring technologies and extensive interpretation of multiple databases.   Critical Care Time devoted to patient care services described in this note is 30 Minutes. This time reflects time of care of this signee: Rory Percy, MD FACP. This critical care time does not reflect procedure time, or teaching time or supervisory time of PA/NP/Med student/Med Resident etc but could involve care discussion time. Rest per NP/medical resident whose note is outlined above and that I agree with   Mcarthur Rossetti. Tyson Alias, MD, FACP Pgr: [phone] [facility] 09/05/2016 2:23 PM

## 2016-09-16 NOTE — Progress Notes (Signed)
PT Cancellation Note  Patient Details Name: [name] Fields MRN: [id number] DOB: [birth year]   Cancelled Treatment:    Reason Eval/Treat Not Completed: Medical issues which prohibited therapy;Patient not medically ready. Pt with medical decline today, awaiting CCM consult for respiratory distress/tachycardia. Will hold PT today until pt is medically stable. RN (Marissa) and Md (Dr. Roda ShuttersXu) aware of PT holding today. Will follow up at a later date.  [name] KusterBury, [name] Fields 03-16-17, 11:54 AM   [name] KusterKathy Rogerick Fields, PTA, CLT Acute Rehab Services [phone]- [phone] 11-25-16, 11:56 AM

## 2016-09-16 NOTE — Progress Notes (Signed)
Patient arrived to endoscopy for TEE today, respirations labored, Dr. Duke Salviaandolph in to assess patient. TEE cancelled today per Dr. Duke Salviaandolph. Patient notified of this, she expresses understanding and agrees. Patient primary RN aware. Patient returned to hospital room.

## 2016-09-16 NOTE — Progress Notes (Signed)
RN called pt for acute onset SOB, elevated HR and BP. HR at 120-130, Temp 100.2, RR 27, and complaining of difficulty catching breath. Came to the bedside, pt has respiratory distress, fast breathing, bilaterally coarse rhonchi with wheezing. O2 sat 95-99%. Concerning for aspiration vs. CHF. Stopped tube feeding, continue IVF, gave 40mg  lasix, ordered stat CXR and ABG. Less likely for PE at this time due to adequate O2 sat, but will see what CXR shows first. May consider CTA chest to rule out PE later. Also called rapid response for help. Pt is DNR.   [name] PlanJindong Derriana Oser, MD PhD Stroke Neurology 08-22-2016 11:31 AM

## 2016-09-16 NOTE — Progress Notes (Signed)
Note events of this am.  Rapid response called.  I will continue to follow pt's course for possible IP Rehab when stable and pending insurance authorization, tolerance for activity and bed availability.    Weldon PickingSusan Agusta Hackenberg PT Inpatient Rehab Admissions Coordinator Cell [phone] Office [phone]

## 2016-09-16 NOTE — Progress Notes (Signed)
Patient with sudden onset of tachypnea and tachycardia at 1030am.  Retractions noted during respirations. MD notified and attended to patient's bedside.  Rapid response nurse to room and critical care MD consulted. Patient's daughter in law arrived to bedside. Will make decision about possible comfort care with son who is out of town.  Possible options for lengthening life reviewed with decreased probability of increasing quality of life. Lawson RadarHeather M Ofilia Rayon

## 2016-09-16 NOTE — Progress Notes (Signed)
SUBJECTIVE (INTERVAL HISTORY) Daughter in law is at bedside. I also discussed with son over the phone. TEE not done today as pt still lethargic. Shortly after returning to room, at 10:45am, pt had acute respiratory deterioration. SOB, respiratory distress, HR and BP elevated. CCM consulted requested, CXR showed possible CHF, lasix given, also concerning for upper airway aspiration, ABG done, not consistent with PE, pt was put on NRB. Repeated lasix and also 1mg  morphine. However, pt condition did not improve. Still tachycardia, tachypnea. Discussed with daughter in law at bedside and son over the phone. Pt is DNR and DNI, with left hemiplegia after stroke, they requested no further aggressive care and requested comfort care. Morphine drip started for comfort measures.     OBJECTIVE Temp:  [98.8 F (37.1 C)-100.2 F (37.9 C)] 100.2 F (37.9 C) (01/30 1035) Pulse Rate:  [104-142] 131 (01/30 1210) Cardiac Rhythm: Sinus tachycardia (01/30 0700) Resp:  [20-30] 30 (01/30 1210) BP: (153-198)/(77-110) 179/110 (01/30 1210) SpO2:  [91 %-99 %] 99 % (01/30 1210) Weight:  [129 lb 12.8 oz (58.9 kg)] 129 lb 12.8 oz (58.9 kg) (01/30 0500)  CBC:   Recent Labs Lab 09/04/2016 1123  09/15/16 0143 09-28-16 0234  WBC 8.9  < > 12.3* 14.1*  NEUTROABS 6.5  --   --   --   HGB 12.4  < > 12.8 13.4  HCT 38.4  < > 38.6 40.4  MCV 84.4  < > 82.1 81.9  PLT 270  < > 269 256  < > = values in this interval not displayed.  Basic Metabolic Panel:   Recent Labs Lab 09/15/16 0143 28-Sep-2016 0234  NA 136 138  K 2.4* 3.2*  CL 106 107  CO2 22 22  GLUCOSE 96 126*  BUN 11 8  CREATININE 0.58 0.54  CALCIUM 7.9* 8.2*    Lipid Panel:     Component Value Date/Time   CHOL 217 (H) 09/12/2016 0324   TRIG 76 09/12/2016 0324   HDL 49 09/12/2016 0324   CHOLHDL 4.4 09/12/2016 0324   VLDL 15 09/12/2016 0324   LDLCALC 153 (H) 09/12/2016 0324   HgbA1c:  Lab Results  Component Value Date   HGBA1C 5.6 09/12/2016   Urine  Drug Screen: No results found for: LABOPIA, COCAINSCRNUR, LABBENZ, AMPHETMU, THCU, LABBARB    IMAGING I have personally reviewed the radiological images below and agree with the radiology interpretations.  CTA Head and Neck 09/13/2016  CTA NECK  1. Mild for age atheromatous plaque about the carotid bifurcations without flow-limiting stenosis. 2. Subtle intimal changes with beading at the distal right ICA, suggesting mild FMD. No associated significant stenosis or other intraluminal abnormality identified. 3. Short-segment atheromatous moderate stenosis at the origin of the left vertebral artery. Vertebral arteries otherwise widely patent within the neck. 4. Short-segment moderate stenosis of approximately 50% at the proximal right subclavian artery.  CTA HEAD  1. Continued interval evolution of right lenticular nucleus infarct. Small 4 mm focus of associated hemorrhage unchanged. 2. Moderate atheromatous disease involving the right anterior and cerebral arteries, with several notable stenoses involving the right A2 and proximal M2 branches as above. 3. Moderate atheromatous disease involving the PCAs bilaterally. Overall, disease burden is more severe within the left PCA, although there is a short-segment moderate proximal right P1 stenosis.  Ct Head Wo Contrast 09/12/2016 1. Evolving acute ischemic infarct within the right basal ganglia without significant mass effect.  Faint 3 mm hyperdensity within this region may reflect a small focus of  petechial hemorrhage versus artifact.  Attention at follow-up recommended. No frank hemorrhagic transformation.  2. Otherwise stable appearance of the brain.   Ct Cervical Spine Wo Contrast 08/31/2016 No evidence of acute cervical spine injury.   Ct Head Code Stroke W/o Cm 08/21/2016 1. Stable noncontrast CT appearance of the brain since 2016. No acute cortically based infarct or acute intracranial hemorrhage identified.  2. ASPECTS is 10.   Ct  Head Wo Contrast - post t-PA  09/12/2016 1. Evolving acute ischemic infarct within the right basal ganglia without significant mass effect.  Faint 3 mm hyperdensity within this region may reflect a small focus of petechial hemorrhage versus artifact.  Attention at follow-up recommended. No frank hemorrhagic transformation. 2. Otherwise stable appearance of the brain.  MRI / MRA Brain Wo Contrast  09/12/2016 MRI Acute infarct right lenticular nucleus. 7 mm focus of hemorrhage within the infarct. Atrophy with chronic microvascular ischemia  MRA  Images degraded by motion and intracranial vessel tortuosity. Moderate focal stenosis proximal right posterior cerebral artery. Focal signal loss right M1 segment likely due to tortuosity. Moderate stenosis right A2 segment.  Transthoracic Echocardiogram 07/12/2016 Study Conclusions - Left ventricle: The cavity size was normal. There was moderate   concentric hypertrophy. Systolic function was vigorous. The   estimated ejection fraction was in the range of 65% to 70%. Wall   motion was normal; there were no regional wall motion   abnormalities. Doppler parameters are consistent with abnormal   left ventricular relaxation (grade 1 diastolic dysfunction).   Doppler parameters are consistent with elevated ventricular   end-diastolic filling pressure. - Aortic valve: Trileaflet; normal thickness leaflets. There was   mild regurgitation. - Mitral valve: Calcified annulus. There was mild regurgitation. - Left atrium: The atrium was normal in size. - Right ventricle: Systolic function was normal. - Tricuspid valve: There was no regurgitation. - Pulmonary arteries: Systolic pressure could not be accurately   estimated. - Inferior vena cava: The vessel was normal in size. - Pericardium, extracardiac: There was no pericardial effusion. Impressions: No cardiac source of emboli was indentified.  LE venous doppler pending 09/13/2016 No obvious  evidence of deep vein thrombosis involving the visualized veins of the bilateral lower extremities.  Carotid Ultrasound 09/12/2016 Bilateral 1-39% ICA stenosis, antegrade vertebral flow.  Dg Chest Port 1 View 08/25/2016 IMPRESSION: Cardiomegaly, vascular congestion. Elevated right hemidiaphragm.  Bibasilar atelectasis.     PHYSICAL EXAM  Temp:  [98.8 F (37.1 C)-100.2 F (37.9 C)] 100.2 F (37.9 C) (01/30 1035) Pulse Rate:  [104-142] 131 (01/30 1210) Resp:  [20-30] 30 (01/30 1210) BP: (153-198)/(77-110) 179/110 (01/30 1210) SpO2:  [91 %-99 %] 99 % (01/30 1210) Weight:  [129 lb 12.8 oz (58.9 kg)] 129 lb 12.8 oz (58.9 kg) (01/30 0500)  General - Well nourished, well developed, in acute respiratory distress, on NRB.  Ophthalmologic - Fundi not visualized due to noncooperation.  Cardiovascular - tachycardia, regular rhythm.  Lung - bilateral coarse rhonchi, stridor and wheezing  Neuro -  Lethargic but awake, eyes open, not able to speak in full sentences due to respiratory distress, moderate dysarthria. right gaze preference, but able to cross midline. left facial droop, left hemiplegia.    ASSESSMENT/PLAN [name] is a 83 y.o. female with history of hypertension, hyperlipidemia, coronary artery disease, spinal stenosis, and aortic stenosis presenting with speech difficulties, left sided weakness, and right gaze deviation.  She received IV t-PA on 08/22/2016 at 1200.  Acute respiratory distress  CXR concerning for  CHF -  But not responding to repeated doses of lasix and low dose morphine  ABG showed respiratory alkolosis and high PO2 and not consistent with PE  Bilateral rhonchi, wheezing and stridor concerning for upper airway aspiration  CCM on board, appreciate consult  Family requested comfort care   On morphine drip for comfort care measures.  Stroke:  Right BG/CR large infarct, more embolic pattern secondary to unknown source  Resultant  left  hemiplegia, left gaze palsy and left neglect   MRI - Acute large infarct right BG and CR with small hemorrhagic transformation.  CTA H&N - right M1, and right A2 high grade stenosis, left CCA soft plaque vs. Thrombus, aortic atherosclerosis  Carotid Doppler - unremarkable  2D Echo - EF 65-70%. No cardiac source of emboli identified.  LE venous doppler - negative for DVT  LDL - 153  HgbA1c - 5.6  VTE prophylaxis - lovenox Diet NPO time specified  aspirin 81 mg daily prior to admission  Hypertension  Stable  Hyperlipidemia  Home meds:  No lipid lowering medications prior to admission  LDL 153  Dysphagia  Did not pass swallow  Off NG tube for comfort care   Other Stroke Risk Factors  Advanced age  Coronary artery disease  Other Active Problems  Angioedema after t-PA -> received Solumedrol - will d/c    Hospital day # 5  This patient is critically ill due to acute respiratory distress, aspiration, right MCA infarct and at significant risk of neurological worsening, death form respiratory failure, heart failure, shock. This patient's care requires constant monitoring of vital signs, hemodynamics, respiratory and cardiac monitoring, review of multiple databases, neurological assessment, discussion with family, other specialists and medical decision making of high complexity. I spent 55 minutes of neurocritical care time in the care of this patient.   Marvel Plan, MD PhD Stroke Neurology 2016-10-09 8:03 PM    To contact Stroke Continuity provider, please refer to [email or web address]. After hours, contact General Neurology

## 2016-09-16 NOTE — Progress Notes (Signed)
Course of events noted.  Pt. to be transitioned to comfort care.  I will sign off.    Weldon PickingSusan Akirra Lacerda PT Inpatient Rehab Admissions Coordinator Cell [phone] Office [phone]

## 2016-09-16 NOTE — Consult Note (Signed)
ELECTROPHYSIOLOGY CONSULT NOTE  Patient ID: [name] Fields MRN: [id number], DOB/AGE: 81-Sep-1926   Admit date: 2016-09-26 Date of Consult: 09/06/2016  Primary Physician: Ginette Otto, MD Primary Cardiologist: new to [facility] Reason for Consultation: Cryptogenic stroke; recommendations regarding Implantable Loop Recorder  History of Present Illness [name] Fields was admitted on September 26, 2016 with difficulty speaking and drooping of the left side of her face.  They first developed symptoms while at home with her daughter in law.  Imaging demonstrated non dominant infarct felt to be embolic 2/2 unknown source.  She has undergone workup for stroke including echocardiogram and carotid dopplers.  The patient has been monitored on telemetry which has demonstrated sinus rhythm with no arrhythmias.    Echocardiogram this admission demonstrated EF 65-70%, grade 1 diastolic dysfunction, LA normal in size.  Lab work is reviewed.  She did not pass her swallow screen and currently has a panda.  She remains lethargic. She is not able to participate in meaningful conversation this morning due to fatigue.  ROS is not able to be obtained. The patient states "I am tired".  EP has been asked to evaluate for placement of an implantable loop recorder to monitor for atrial fibrillation.   Past Medical History:  Diagnosis Date  . Aortic stenosis    mild  . Arthritis   . Chest pain   . Coronary artery disease    1st diag. stenosis of 70%  . Hx stress fracture    Lumbar 4-5  . Hyperlipidemia   . Hypertension   . Osteoporosis   . Shingles   . Spinal stenosis   . UTI (lower urinary tract infection)      Surgical History:  Past Surgical History:  Procedure Laterality Date  . ABDOMINAL SURGERY     bowel obstruction  . CARDIAC CATHETERIZATION  10/18/2007   mild to moderate CAD,aggressive medical therapy  . EYE SURGERY     cataracts bil     Prescriptions Prior to Admission    Medication Sig Dispense Refill Last Dose  . amLODipine (NORVASC) 2.5 MG tablet Take 2.5 mg by mouth daily.   2016/09/26 at Unknown time  . aspirin EC 81 MG tablet Take 81 mg by mouth daily.   2016/09/26 at 0840  . escitalopram (LEXAPRO) 10 MG tablet Take 10 mg by mouth daily.  6 09-26-16 at Unknown time  . esomeprazole (NEXIUM) 20 MG capsule Take 20 mg by mouth daily.    26-Sep-2016 at Unknown time  . fluticasone (FLONASE) 50 MCG/ACT nasal spray Place 1 spray into both nostrils daily as needed for rhinitis.    09/10/2016 at Unknown time  . lisinopril (PRINIVIL,ZESTRIL) 5 MG tablet Take 5 mg by mouth daily.  6 September 26, 2016 at Unknown time  . metoprolol succinate (TOPROL-XL) 25 MG 24 hr tablet Take 37.5 mg by mouth daily.   09-26-16 at 0840  . mirabegron ER (MYRBETRIQ) 25 MG TB24 tablet Take 25 mg by mouth at bedtime.    09/10/2016 at Unknown time    Inpatient Medications:  . [MAR Hold]  stroke: mapping our early stages of recovery book   Does not apply Once  . [MAR Hold] aspirin  300 mg Rectal Daily  . [MAR Hold] atorvastatin  40 mg Oral q1800  . [MAR Hold] enoxaparin (LOVENOX) injection  30 mg Subcutaneous Q24H  . [MAR Hold] famotidine (PEPCID) IV  20 mg Intravenous Q12H  . [MAR Hold] feeding supplement (PRO-STAT SUGAR FREE 64)  30 mL Per Tube Daily  . [  MAR Hold] pantoprazole (PROTONIX) IV  40 mg Intravenous QHS    Allergies:  Allergies  Allergen Reactions  . Tape Hives    Social History   Social History  . Marital status: Widowed    Spouse name: N/A  . Number of children: N/A  . Years of education: N/A   Occupational History  . Not on file.   Social History Main Topics  . Smoking status: Never Smoker  . Smokeless tobacco: Never Used  . Alcohol use No  . Drug use: No  . Sexual activity: No   Other Topics Concern  . Not on file   Social History Narrative  . No narrative on file    Family History: not able to be obtained  Review of Systems: All other systems reviewed  and are otherwise negative except as noted above.  Physical Exam: Vitals:   08/18/2016 0500 09/14/2016 0548 08/30/2016 0610 08/26/2016 0824  BP:  (!) 186/90 (!) 179/80 (!) 195/79  Pulse:  (!) 116  (!) 121  Resp:  20  (!) 23  Temp:  99 F (37.2 C)  98.9 F (37.2 C)  TempSrc:  Axillary  Oral  SpO2:  95%  95%  Weight: 129 lb 12.8 oz (58.9 kg)     Height:        GEN- The patient is elderly and ill appearing, alert and oriented x 3 today.   Head- normocephalic, atraumatic Eyes-  Sclera clear, conjunctiva pink Ears- hearing intact Oropharynx- clear Neck- supple Lungs- Clear to ausculation bilaterally, normal work of breathing Heart- Tachycardic regular rate and rhythm  GI- soft, NT, ND, + BS Extremities- no clubbing, cyanosis, or edema MS- no significant deformity or atrophy Skin- no rash or lesion Psych- euthymic mood, full affect   Labs:   Lab Results  Component Value Date   WBC 14.1 (H) 08/27/2016   HGB 13.4 09/05/2016   HCT 40.4 09/06/2016   MCV 81.9 08/24/2016   PLT 256 09/01/2016    Recent Labs Lab 09/14/2016 1123  09/16/16 0234  NA 140  < > 138  K 4.2  < > 3.2*  CL 109  < > 107  CO2 22  < > 22  BUN 11  < > 8  CREATININE 0.72  < > 0.54  CALCIUM 8.8*  < > 8.2*  PROT 6.7  --   --   BILITOT 0.4  --   --   ALKPHOS 54  --   --   ALT 10*  --   --   AST 19  --   --   GLUCOSE 108*  < > 126*  < > = values in this interval not displayed.  Radiology/Studies: Ct Angio Head W Or Wo Contrast Result Date: 09/13/2016 CLINICAL DATA:  Initial evaluation for acute stroke, patient found to have right basal ganglia/lentiform nucleus infarct. Small amount of associated hemorrhage. EXAM: CT ANGIOGRAPHY HEAD AND NECK TECHNIQUE: Multidetector CT imaging of the head and neck was performed using the standard protocol during bolus administration of intravenous contrast. Multiplanar CT image reconstructions and MIPs were obtained to evaluate the vascular anatomy. Carotid stenosis measurements  (when applicable) are obtained utilizing NASCET criteria, using the distal internal carotid diameter as the denominator. CONTRAST:  50 cc of Isovue 370. COMPARISON:  Comparison made with prior CT and MRI/ MRA from 09/12/2016. FINDINGS: CTA NECK FINDINGS Aortic arch: Visualized aortic arch of normal caliber with normal 3 vessel morphology. Scattered atheromatous plaque within the aortic arch/ proximal  descending intrathoracic aorta. Focal plaque at the origin of the left subclavian artery with mild stenosis (approximately 20%). Origin of the great vessels otherwise widely patent. Focal kinking with short-segment narrowing of approximately 50% at the right subclavian artery just distal to the takeoff of the right common carotid artery (series 8, image 142). Subclavian arteries otherwise widely patent. Right carotid system: Right common carotid artery patent from its origin to the bifurcation without stenosis. Mild scattered calcified plaque about the right bifurcation/ proximal right ICA without flow-limiting stenosis. Subtle intimal irregularity with beading within the distal right ICA prior to the skullbase, most suggestive of mild changes related to FMD. No associated significant stenosis or intraluminal thrombus. No raised dissection flap. No significant stenosis identified within the right carotid artery system. Right external carotid artery and its branches within normal limits. Left carotid system: Left common carotid artery mildly tortuous proximally. Left common carotid artery patent from its origin to the bifurcation without stenosis. Eccentric calcified plaque about the left bifurcation/ proximal left ICA without significant stenosis. Left ICA widely patent distally to the skullbase without stenosis, dissection, or occlusion. Left external carotid artery and its branches within normal limits. Vertebral arteries: Both of the vertebral arteries arise from the subclavian arteries. Vertebral arteries largely  code dominant. Moderate atheromatous stenosis at the origin of the left vertebral artery (series 7, image 263). Vertebral arteries otherwise widely patent within the neck without stenosis, dissection, or occlusion. Skeleton: No acute osseous abnormality. No worrisome lytic or blastic osseous lesions. Moderate degenerative spondylolysis noted at C5-6 and C6-7. Other neck: No acute soft tissue abnormality within the neck. No adenopathy. Two small subcentimeter hypodense nodules noted within the left thyroid lobe, of doubtful clinical significance. Upper chest: Visualized upper mediastinum within normal limits. Scattered atelectatic changes noted within the visualized lungs. Review of the MIP images confirms the above findings CTA HEAD FINDINGS Anterior circulation: Petrous segments widely patent bilaterally. Smooth atheromatous plaque throughout the cavernous ICAs without flow-limiting stenosis. ICA termini widely patent. Right A1 segment widely patent. Left A1 segment hypoplastic, which likely accounts for the slightly diminutive left ICA is compared to the right. Anterior communicating artery normal. Mild atheromatous regularity within the left A2 segment and its distal branches without flow-limiting stenosis. More moderate atheromatous plaque within the right is 2 segment, with a superimposed fairly severe short-segment stenosis (series 7, image 72). ACA is are patent to their distal aspects bilaterally. Left M1 segment widely patent without stenosis or occlusion. Left MCA bifurcation normal. No proximal M2 stenosis or occlusion. Distal small vessel atheromatous irregularity noted. Short-segment mild narrowing proximal right M1 segment (Series 7, image 94). Right M1 segment patent distally. Right MCA bifurcation normal. Short-segment severe right M2 stenoses (series 7, image 103). Additional scattered atheromatous irregularity with stenoses involving the right MCA branches, certainly more pronounced as compared to  the left. Posterior circulation: Focal plaque at the left V4 segment as it crosses the dural with mild narrowing. Code dominant vertebral arteries otherwise widely patent to the vertebrobasilar junction. Posterior inferior cerebral arteries patent bilaterally. Basilar artery widely patent. Superior cerebral arteries well opacified bilaterally. Both of the posterior cerebral arteries largely supplied via the basilar artery, although a small bilateral posterior communicating arteries are noted. Mild to moderate atheromatous irregularity throughout the P1 and P2 segments, with superimposed moderate narrowing at the right P1 segment. Overall, disease burden is worse in the left PCA. PCAs are patent to their distal aspects. Venous sinuses: Patent. Anatomic variants: No significant anatomic variant. No  aneurysm or vascular malformation. Delayed phase: No pathologic enhancement. Evolving right basal ganglia infarct with small focus of associated hemorrhage again noted. Hemorrhage measures 4 mm on today's study, stable. No other new acute intracranial process. Review of the MIP images confirms the above findings IMPRESSION: CTA NECK IMPRESSION: 1. Mild for age atheromatous plaque about the carotid bifurcations without flow-limiting stenosis. 2. Subtle intimal changes with beading at the distal right ICA, suggesting mild FMD. No associated significant stenosis or other intraluminal abnormality identified. 3. Short-segment atheromatous moderate stenosis at the origin of the left vertebral artery. Vertebral arteries otherwise widely patent within the neck. 4. Short-segment moderate stenosis of approximately 50% at the proximal right subclavian artery. CTA HEAD IMPRESSION: 1. Continued interval evolution of right lenticular nucleus infarct. Small 4 mm focus of associated hemorrhage unchanged. 2. Moderate atheromatous disease involving the right anterior and cerebral arteries, with several notable stenoses involving the right A2  and proximal M2 branches as above. 3. Moderate atheromatous disease involving the PCAs bilaterally. Overall, disease burden is more severe within the left PCA, although there is a short-segment moderate proximal right P1 stenosis. Electronically Signed   By: Rise Mu M.D.   On: 09/13/2016 02:03   Ct Head Wo Contrast Result Date: 09/12/2016 CLINICAL DATA:  Initial evaluation for acute mental status change. EXAM: CT HEAD WITHOUT CONTRAST TECHNIQUE: Contiguous axial images were obtained from the base of the skull through the vertex without intravenous contrast. COMPARISON:  Prior CT 09/11/2016. FINDINGS: Brain: Stable cerebral volume with chronic microvascular ischemic disease. Now evident is an evolving acute ischemic infarct involving the right basal ganglia, specifically involving the right lentiform nucleus and extending towards the periventricular white matter of the right corona radiata. No significant mass effect. There is a punctate 3 mm mildly hyperdense focus within this region which may reflect a tiny focus of associated petechial hemorrhage (series 201, image 17). Alternatively, this may be artifactual in nature on this exam. No frank hemorrhagic transformation. No other evidence for acute or evolving ischemia. No mass lesion, midline shift or mass effect. Ventricular prominence related to global parenchymal volume loss of hydrocephalus. No extra-axial fluid collection. Vascular: No hyperdense vessel. Vascular calcifications noted within the carotid siphons. Skull: Scalp soft tissues and calvarium are stable without abnormality. Sinuses/Orbits: Globes and orbital soft tissues within normal limits. Paranasal sinuses and mastoids are clear. IMPRESSION: 1. Evolving acute ischemic infarct within the right basal ganglia without significant mass effect. Faint 3 mm hyperdensity within this region may reflect a small focus of petechial hemorrhage versus artifact. Attention at follow-up recommended. No  frank hemorrhagic transformation. 2. Otherwise stable appearance of the brain. Electronically Signed   By: Rise Mu M.D.   On: 09/12/2016 05:39   12-lead ECG sinus rhythm All prior EKG's in EP[facility] reviewed with no documented atrial fibrillation  Telemetry sinus rhythm, sinus tach   Assessment and Plan:  1. Cryptogenic stroke The patient presents with cryptogenic stroke.  The patient was originally planned for TEE this morning, but is currently too ill to undergo invasive procedures. Would recommend inpatient rehab as planned and then evaluate for TEE/ILR if she makes meaningful recovery from stroke.    Could consider 30 day monitor at discharge. Dr Johney Frame to see later today.   Gypsy Balsam, NP 09/16/2016 8:34 AM  I have seen, examined the patient, and reviewed the above assessment and plan.  On exam, she is fragile and ill appearing.  In extremis. Changes to above are made where necessary.  She is not a candidate for EP procedures.  Ultimately I suspect that palliative measures may be best.  I have spoken with Dr Roda ShuttersXu who is at bedside and actively managing her acute decompensation.  Rapid response has been called.  She is DNR.  Electrophysiology team to see as needed while here. Please call with questions.   Co Sign: Hillis RangeJames Saifan Rayford, MD 09/01/2016 11:16 AM

## 2016-09-17 ENCOUNTER — Encounter (HOSPITAL_COMMUNITY): Payer: Self-pay | Admitting: Cardiovascular Disease

## 2016-09-17 MED ORDER — SCOPOLAMINE 1 MG/3DAYS TD PT72
1.0000 | MEDICATED_PATCH | TRANSDERMAL | Status: DC
  Filled 2016-09-17: qty 1

## 2016-09-18 NOTE — Progress Notes (Signed)
Pt's iv morphine was d/c'd and about 225 ml was wasted alongside the charge [employer] Ashley. Obasogie-Asidi, Berdene Askari Efe

## 2016-09-18 NOTE — Progress Notes (Addendum)
Pt respiration observed to be heavy and labored with oral secretions,, suctioning done and iv rubinul 0.2 mg given at 0302, o2 sat at 78% on 2LNC, increesed to 3L, Pulse rate fluctuating b/w 50s to 150s, BP 125/56, pt repositioned in bed, will however continue to monitor. Obasogie-Asidi, Michaelangelo Mittelman Efe  Pt put on a non- re breather mask, o2 sat came up to 96%. Obasogie-Asidi, Dicy Smigel Efe

## 2016-09-18 NOTE — Progress Notes (Signed)
Pt observed to have stopped breathing at [phone], verified by the Charge Nurse Morrie SheldonAshley, Dr Otelia LimesLindzen (on call) paged to be notified, yet to call back, Family (son) also called earlier by phone, said to be on his way. Obasogie-Asidi, Amadi Frady Efe

## 2016-09-18 DEATH — deceased

## 2016-10-16 NOTE — Discharge Summary (Signed)
Stroke Discharge Summary  Patient ID: [name] Fields   MRN: [id number]      DOB: [birth year]  Date of Admission: 08/28/2016 Date of Discharge: 09/18/2016  Attending Physician:  No att. providers found, Stroke MD Consulting Physician(s):    pulmonary/intensive care  Patient's PCP:  Mathews Argyle, MD  DISCHARGE DIAGNOSIS: respiratory failure, right MCA stroke  Active Problems:   Acute ischemic stroke ([place])   History of adverse reaction to tissue plasminogen activator (tPA)   Aortic valve stenosis   Benign essential HTN   Coronary artery disease involving native coronary artery of native heart without angina pectoris   Mixed hyperlipidemia   Diastolic dysfunction   Tachycardia   Vitamin B 12 deficiency   Leukocytosis   Dysphagia   Somnolence   Cerebrovascular accident (CVA) due to thrombosis of precerebral artery (HCC)  BMI: Body mass index is 25.35 kg/m.  Past Medical History:  Diagnosis Date  . Aortic stenosis    mild  . Arthritis   . Chest pain   . Coronary artery disease    1st diag. stenosis of 70%  . Hx stress fracture    Lumbar 4-5  . Hyperlipidemia   . Hypertension   . Osteoporosis   . Shingles   . Spinal stenosis   . UTI (lower urinary tract infection)    Past Surgical History:  Procedure Laterality Date  . ABDOMINAL SURGERY     bowel obstruction  . CARDIAC CATHETERIZATION  10/18/2007   mild to moderate CAD,aggressive medical therapy  . EYE SURGERY     cataracts bil    Allergies as of 10/01/16      Reactions   Tape Hives      Medication List    ASK your doctor about these medications   amLODipine 2.5 MG tablet Commonly known as:  NORVASC Take 2.5 mg by mouth daily.   aspirin EC 81 MG tablet Take 81 mg by mouth daily.   escitalopram 10 MG tablet Commonly known as:  LEXAPRO Take 10 mg by mouth daily.   esomeprazole 20 MG capsule Commonly known as:  NEXIUM Take 20 mg by mouth daily.   fluticasone 50 MCG/ACT nasal  spray Commonly known as:  FLONASE Place 1 spray into both nostrils daily as needed for rhinitis.   lisinopril 5 MG tablet Commonly known as:  PRINIVIL,ZESTRIL Take 5 mg by mouth daily.   metoprolol succinate 25 MG 24 hr tablet Commonly known as:  TOPROL-XL Take 37.5 mg by mouth daily.   MYRBETRIQ 25 MG Tb24 tablet Generic drug:  mirabegron ER Take 25 mg by mouth at bedtime.       LABORATORY STUDIES CBC    Component Value Date/Time   WBC 14.1 (H) 09/13/2016 0234   RBC 4.93 09/10/2016 0234   HGB 13.4 09/06/2016 0234   HCT 40.4 09/13/2016 0234   PLT 256 09/01/2016 0234   MCV 81.9 09/05/2016 0234   MCH 27.2 09/10/2016 0234   MCHC 33.2 09/05/2016 0234   RDW 13.7 08/26/2016 0234   LYMPHSABS 1.7 09/06/2016 1123   MONOABS 0.6 09/05/2016 1123   EOSABS 0.1 08/31/2016 1123   BASOSABS 0.1 09/07/2016 1123   CMP    Component Value Date/Time   NA 138 09/13/2016 0234   K 3.2 (L) 09/02/2016 0234   CL 107 09/12/2016 0234   CO2 22 08/20/2016 0234   GLUCOSE 126 (H) 08/19/2016 0234   BUN 8 09/14/2016 0234   CREATININE 0.54 09/06/2016 0234  CALCIUM 8.2 (L) 09/09/2016 0234   PROT 6.7 08/24/2016 1123   ALBUMIN 3.6 09/02/2016 1123   AST 19 08/25/2016 1123   ALT 10 (L) 08/18/2016 1123   ALKPHOS 54 08/29/2016 1123   BILITOT 0.4 09/15/2016 1123   GFRNONAA >60 09/12/2016 0234   GFRAA >60 08/31/2016 0234   COAGS Lab Results  Component Value Date   INR 1.09 08/24/2016   INR 1.1 10/18/2007   Lipid Panel    Component Value Date/Time   CHOL 217 (H) 09/12/2016 0324   TRIG 76 09/12/2016 0324   HDL 49 09/12/2016 0324   CHOLHDL 4.4 09/12/2016 0324   VLDL 15 09/12/2016 0324   LDLCALC 153 (H) 09/12/2016 0324   HgbA1C  Lab Results  Component Value Date   HGBA1C 5.6 09/12/2016   Cardiac Panel (last 3 results) No results for input(s): CKTOTAL, CKMB, TROPONINI, RELINDX in the last 72 hours. Urinalysis    Component Value Date/Time   COLORURINE YELLOW 03/21/2015 1835    APPEARANCEUR CLOUDY (A) 03/21/2015 1835   LABSPEC 1.016 03/21/2015 1835   PHURINE 6.0 03/21/2015 1835   GLUCOSEU NEGATIVE 03/21/2015 1835   HGBUR SMALL (A) 03/21/2015 1835   BILIRUBINUR NEGATIVE 03/21/2015 1835   KETONESUR NEGATIVE 03/21/2015 1835   PROTEINUR NEGATIVE 03/21/2015 1835   UROBILINOGEN 1.0 03/21/2015 1835   NITRITE POSITIVE (A) 03/21/2015 1835   LEUKOCYTESUR MODERATE (A) 03/21/2015 1835   Urine Drug Screen No results found for: LABOPIA, COCAINSCRNUR, LABBENZ, AMPHETMU, THCU, LABBARB  Alcohol Level No results found for: [facility]   SIGNIFICANT DIAGNOSTIC STUDIES CTA Head and Neck 09/13/2016  CTA NECK  1. Mild for age atheromatous plaque about the carotid bifurcations without flow-limiting stenosis. 2. Subtle intimal changes with beading at the distal right ICA, suggesting mild FMD. No associated significant stenosis or other intraluminal abnormality identified. 3. Short-segment atheromatous moderate stenosis at the origin of the left vertebral artery. Vertebral arteries otherwise widely patent within the neck. 4. Short-segment moderate stenosis of approximately 50% at the proximal right subclavian artery.  CTA HEAD  1. Continued interval evolution of right lenticular nucleus infarct. Small 4 mm focus of associated hemorrhage unchanged. 2. Moderate atheromatous disease involving the right anterior and cerebral arteries, with several notable stenoses involving the right A2 and proximal M2 branches as above. 3. Moderate atheromatous disease involving the PCAs bilaterally. Overall, disease burden is more severe within the left PCA, although there is a short-segment moderate proximal right P1 stenosis.  Ct Head Wo Contrast 09/12/2016 1. Evolving acute ischemic infarct within the right basal ganglia without significant mass effect.  Faint 3 mm hyperdensity within this region may reflect a small focus of petechial hemorrhage versus artifact.  Attention at follow-up recommended.  No frank hemorrhagic transformation.  2. Otherwise stable appearance of the brain.   Ct Cervical Spine Wo Contrast 09/15/2016 No evidence of acute cervical spine injury.   Ct Head Code Stroke W/o Cm 08/21/2016 1. Stable noncontrast CT appearance of the brain since 2016. No acute cortically based infarct or acute intracranial hemorrhage identified.  2. ASPECTS is 10.   Ct Head Wo Contrast - post t-PA  09/12/2016 1. Evolving acute ischemic infarct within the right basal ganglia without significant mass effect.  Faint 3 mm hyperdensity within this region may reflect a small focus of petechial hemorrhage versus artifact.  Attention at follow-up recommended. No frank hemorrhagic transformation. 2. Otherwise stable appearance of the brain.  MRI / MRA Brain Wo Contrast  09/12/2016 MRI Acute infarct right lenticular nucleus.  7 mm focus of hemorrhage within the infarct. Atrophy with chronic microvascular ischemia  MRA  Images degraded by motion and intracranial vessel tortuosity. Moderate focal stenosis proximal right posterior cerebral artery. Focal signal loss right M1 segment likely due to tortuosity. Moderate stenosis right A2 segment.  Transthoracic Echocardiogram 07/12/2016 Study Conclusions - Left ventricle: The cavity size was normal. There was moderate concentric hypertrophy. Systolic function was vigorous. The estimated ejection fraction was in the range of 65% to 70%. Wall motion was normal; there were no regional wall motion abnormalities. Doppler parameters are consistent with abnormal left ventricular relaxation (grade 1 diastolic dysfunction). Doppler parameters are consistent with elevated ventricular end-diastolic filling pressure. - Aortic valve: Trileaflet; normal thickness leaflets. There was mild regurgitation. - Mitral valve: Calcified annulus. There was mild regurgitation. - Left atrium: The atrium was normal in size. - Right ventricle:  Systolic function was normal. - Tricuspid valve: There was no regurgitation. - Pulmonary arteries: Systolic pressure could not be accurately estimated. - Inferior vena cava: The vessel was normal in size. - Pericardium, extracardiac: There was no pericardial effusion. Impressions: No cardiac source of emboli was indentified.  LE venous doppler pending 09/13/2016 No obvious evidence of deep vein thrombosis involving the visualized veins of the bilateral lower extremities.  Carotid Ultrasound 09/12/2016 Bilateral 1-39% ICA stenosis, antegrade vertebral flow.  Dg Chest Port 1 View 08/31/2016 IMPRESSION: Cardiomegaly, vascular congestion. Elevated right hemidiaphragm.  Bibasilar atelectasis.      HISTORY OF PRESENT ILLNESS Reason for Admission: Acute ischemic stroke s/p tPA  CC: Left-sided weakness  HPI: This is an 83-yo RH woman who is brought to the ED by EMS as a CODE STROKE. History is obtained directly from the patient. I have obtained additional information from EMS and from the patient's daughter-in-law who is present at the bedside and who serves as that patient's primary caregiver.   The patient was in her usual state of good health until this morning when she fell from her bed. Her daughter-in-law reports that when she checked on her she noted that the left side of her mouth appeared twisted and she was having difficulty speaking. She called 911 and as she was on the phone with the operator the patient's symptoms completely resolved. EMS arrived and report that she initially had no deficits but subsequently developed weakness affecting the L face, arm, and leg with a R gaze deviation. This lasted for a few minutes but again completely resolved by the time they arrived at the ED. They activated CODE STROKE and I met the patient on her arrival in the ED with the rest of the stroke team.   On arrival in the ED the patient was free of deficits. She was taken for an emergent  CT of the head. As we arrived in the CT scanner, she again developed a R gaze with L hemiparesis affecting the face, arm, and leg equally. This has persisted. CTH showed no acute abnormality. NIHSS was 10. SBP 150s-160s. After discussion with the patient's daughter-in-law, the decision was made to proceed with tPA. The patient received a bolus of 5 mg at 1157 with 49 mg infused over the next hour.   The patient's daughter-in-law reports that at baseline the patient is completely independent.   Last known well: 08/28/2016 at 1035 NHISS score: 10 mRS score: 0 tPA given?: Yes   HOSPITAL COURSE [name] is a 83 y.o. female with history of hypertension, hyperlipidemia, coronary artery disease, spinal stenosis, and aortic stenosis  presenting with speech difficulties, left sided weakness, and right gaze deviation.  She received IV t-PA on 09/15/2016 at 1200.  Acute respiratory distress  CXR concerning for CHF -  But not responding to repeated doses of lasix and low dose morphine  ABG showed respiratory alkolosis and high PO2 and not consistent with PE  Bilateral rhonchi, wheezing and stridor concerning for upper airway aspiration  CCM on board, appreciate consult  Family requested comfort care   On morphine drip for comfort care measures.  Stroke:  Right BG/CR large infarct, more embolic pattern secondary to unknown source  Resultant  left hemiplegia, left gaze palsy and left neglect   MRI - Acute large infarct right BG and CR with small hemorrhagic transformation.  CTA H&N - right M1, and right A2 high grade stenosis, left CCA soft plaque vs. Thrombus, aortic atherosclerosis  Carotid Doppler - unremarkable  2D Echo - EF 65-70%. No cardiac source of emboli identified.  LE venous doppler - negative for DVT  LDL - 153  HgbA1c - 5.6  VTE prophylaxis - lovenox  Diet NPO time specified  aspirin 81 mg daily prior to  admission  Hypertension  Stable  Hyperlipidemia  Home meds:  No lipid lowering medications prior to admission  LDL 153  Dysphagia  Did not pass swallow  Off NG tube for comfort care   Other Stroke Risk Factors  Advanced age  Coronary artery disease  Other Active Problems  Angioedema after t-PA -> received Solumedrol - will d/c   DISCHARGE EXAM Pt deceased.   Rosalin Hawking, MD PhD Stroke Neurology 09/18/2016 12:19 AM
